# Patient Record
Sex: Male | Born: 1950 | Race: White | Hispanic: No | Marital: Married | Smoking: Former smoker
Health system: Southern US, Community
[De-identification: ages and names within clinical notes are randomized; demographics above are authoritative.]

## PROBLEM LIST (undated history)

## (undated) DIAGNOSIS — E0590 Thyrotoxicosis, unspecified without thyrotoxic crisis or storm: Secondary | ICD-10-CM

## (undated) DIAGNOSIS — I10 Essential (primary) hypertension: Secondary | ICD-10-CM

## (undated) DIAGNOSIS — E785 Hyperlipidemia, unspecified: Secondary | ICD-10-CM

## (undated) DIAGNOSIS — K219 Gastro-esophageal reflux disease without esophagitis: Secondary | ICD-10-CM

## (undated) HISTORY — DX: Essential (primary) hypertension: I10

## (undated) HISTORY — DX: Hyperlipidemia, unspecified: E78.5

## (undated) HISTORY — DX: Gastro-esophageal reflux disease without esophagitis: K21.9

## (undated) HISTORY — DX: Thyrotoxicosis, unspecified without thyrotoxic crisis or storm: E05.90

## (undated) HISTORY — PX: NO PAST SURGERIES: SHX2092

---

## 2013-10-29 DIAGNOSIS — K219 Gastro-esophageal reflux disease without esophagitis: Secondary | ICD-10-CM

## 2013-10-29 DIAGNOSIS — I10 Essential (primary) hypertension: Secondary | ICD-10-CM | POA: Insufficient documentation

## 2013-10-29 HISTORY — DX: Gastro-esophageal reflux disease without esophagitis: K21.9

## 2013-10-29 HISTORY — DX: Essential (primary) hypertension: I10

## 2013-12-24 DIAGNOSIS — E785 Hyperlipidemia, unspecified: Secondary | ICD-10-CM

## 2013-12-24 HISTORY — DX: Hyperlipidemia, unspecified: E78.5

## 2016-06-17 DIAGNOSIS — E0590 Thyrotoxicosis, unspecified without thyrotoxic crisis or storm: Secondary | ICD-10-CM

## 2016-06-17 HISTORY — DX: Thyrotoxicosis, unspecified without thyrotoxic crisis or storm: E05.90

## 2016-10-17 DIAGNOSIS — F418 Other specified anxiety disorders: Secondary | ICD-10-CM

## 2016-10-17 HISTORY — DX: Other specified anxiety disorders: F41.8

## 2018-05-03 ENCOUNTER — Ambulatory Visit (INDEPENDENT_AMBULATORY_CARE_PROVIDER_SITE_OTHER): Payer: 59 | Admitting: Cardiology

## 2018-05-03 ENCOUNTER — Encounter: Payer: Self-pay | Admitting: Cardiology

## 2018-05-03 VITALS — BP 130/74 | HR 73 | Ht 72.0 in | Wt 208.1 lb

## 2018-05-03 DIAGNOSIS — E782 Mixed hyperlipidemia: Secondary | ICD-10-CM

## 2018-05-03 DIAGNOSIS — I10 Essential (primary) hypertension: Secondary | ICD-10-CM

## 2018-05-03 DIAGNOSIS — E0590 Thyrotoxicosis, unspecified without thyrotoxic crisis or storm: Secondary | ICD-10-CM | POA: Diagnosis not present

## 2018-05-03 DIAGNOSIS — R002 Palpitations: Secondary | ICD-10-CM

## 2018-05-03 HISTORY — DX: Palpitations: R00.2

## 2018-05-03 NOTE — Progress Notes (Signed)
Cardiology Consultation:    Date:  05/03/2018   ID:  Nicolas Stevens, DOB 29-Dec-1950, MRN [id number]  PCP:  Estell HarpinGarner Brown, Rutha BouchardKathy Leigh, NP  Cardiologist:  Gypsy Balsamobert Eimy Plaza, MD   Referring MD: Estell HarpinGarner Brown, Nigel MormonKathy Lei*   Chief Complaint  Patient presents with  . Palpitations  . Chest Pain    thought to be from doing work around the house, no longer drinking soft drinks  Mutations  History of Present Illness:    Nicolas Stevens is a 67 y.o. male who is being seen today for the evaluation of dictations at the request of Estell HarpinGarner Brown, Nigel MormonKathy Lei*.  For last few months he is being experiencing palpitations he will feel his heart skipping.  There is no sustained arrhythmias.  Usually does some momentary sensation there is no chest pain no shortness of breath no sweating associated with this sensation.  He is overall nervous person he does have anxiety which obviously complicate this matter somewhat.  Described to have some soreness in the chest after he washed his camper but it is reproducible by pressing chest wall.  He can walk climb stairs with no difficulties.  He retired in January.  Slowly adjusting to his new life.  Has never had any heart trouble.  Past Medical History:  Diagnosis Date  . Esophageal reflux 10/29/2013  . Hyperlipidemia 12/24/2013  . Hypertension, benign 10/29/2013  . Hyperthyroidism 06/17/2016      Current Medications: Current Meds  Medication Sig  . aspirin EC 81 MG tablet Take 1 tablet by mouth daily.  Marland Kitchen. buPROPion (WELLBUTRIN XL) 150 MG 24 hr tablet Take 1 tablet by mouth daily.  . diclofenac (VOLTAREN) 75 MG EC tablet   . lisinopril (PRINIVIL,ZESTRIL) 20 MG tablet Take 1 tablet by mouth daily.  Marland Kitchen. LORazepam (ATIVAN) 0.5 MG tablet Take 1 tablet by mouth 2 (two) times daily.  . methimazole (TAPAZOLE) 5 MG tablet Take 1 tablet by mouth daily.  . metoprolol succinate (TOPROL-XL) 25 MG 24 hr tablet Take 1 tablet by mouth daily.  . mupirocin ointment (BACTROBAN) 2 %  Apply topically 2 (two) times daily.  Marland Kitchen. omeprazole (PRILOSEC) 20 MG capsule Take 1 capsule by mouth daily.     Allergies:   Penicillins and Sulfa antibiotics   Social History   Socioeconomic History  . Marital status: Married    Spouse name: Not on file  . Number of children: Not on file  . Years of education: Not on file  . Highest education level: Not on file  Occupational History  . Not on file  Social Needs  . Financial resource strain: Not on file  . Food insecurity:    Worry: Not on file    Inability: Not on file  . Transportation needs:    Medical: Not on file    Non-medical: Not on file  Tobacco Use  . Smoking status: Former Games developermoker  . Smokeless tobacco: Never Used  Substance and Sexual Activity  . Alcohol use: Never    Frequency: Never  . Drug use: Never  . Sexual activity: Not on file  Lifestyle  . Physical activity:    Days per week: Not on file    Minutes per session: Not on file  . Stress: Not on file  Relationships  . Social connections:    Talks on phone: Not on file    Gets together: Not on file    Attends religious service: Not on file    Active member of  club or organization: Not on file    Attends meetings of clubs or organizations: Not on file    Relationship status: Not on file  Other Topics Concern  . Not on file  Social History Narrative  . Not on file     Family History: The patient's family history includes Cancer in his father. ROS:   Please see the history of present illness.    All 14 point review of systems negative except as described per history of present illness.  EKGs/Labs/Other Studies Reviewed:    The following studies were reviewed today:   EKG:  EKG is  ordered today.  The ekg ordered today demonstrates normal sinus rhythm normal P interval incomplete right bundle branch block nonspecific ST segment changes  Recent Labs: No results found for requested labs within last 8760 hours.  Recent Lipid Panel No results  found for: CHOL, TRIG, HDL, CHOLHDL, VLDL, LDLCALC, LDLDIRECT  Physical Exam:    VS:  BP 130/74   Pulse 73   Ht 6' (1.829 m)   Wt 208 lb 1.9 oz (94.4 kg)   SpO2 98%   BMI 28.23 kg/m     Wt Readings from Last 3 Encounters:  05/03/18 208 lb 1.9 oz (94.4 kg)     GEN:  Well nourished, well developed in no acute distress HEENT: Normal NECK: No JVD; No carotid bruits LYMPHATICS: No lymphadenopathy CARDIAC: RRR, no murmurs, no rubs, no gallops RESPIRATORY:  Clear to auscultation without rales, wheezing or rhonchi  ABDOMEN: Soft, non-tender, non-distended MUSCULOSKELETAL:  No edema; No deformity  SKIN: Warm and dry NEUROLOGIC:  Alert and oriented x 3 PSYCHIATRIC:  Normal affect   ASSESSMENT:    1. Hypertension, benign   2. Palpitations   3. Hyperthyroidism   4. Mixed hyperlipidemia    PLAN:    In order of problems listed above:  1. Palpitations: Most likely extrasystole will ask him to wear long-term Holter.  He tells me that he got palpitations may be once a week.  I will also ask him to have an echocardiogram make sure that structurally his heart is normal. 2. Initial hypertension on appropriate medications appears to be well controlled right now with addition to beta-blocker 3. Hypothyroidism.  Has been controlled with medications he has been followed by internal medicine team. 4. Mixed dyslipidemia not at the point of medications yet however we talked about healthy lifestyle exercises on the regular basis and good diet.  Back in 1 month sooner if he get a problem   Medication Adjustments/Labs and Tests Ordered: Current medicines are reviewed at length with the patient today.  Concerns regarding medicines are outlined above.  No orders of the defined types were placed in this encounter.  No orders of the defined types were placed in this encounter.   Signed, Georgeanna Lea, MD, [facility]. 05/03/2018 11:17 AM    [facility]

## 2018-05-03 NOTE — Patient Instructions (Signed)
Medication Instructions:  Your physician recommends that you continue on your current medications as directed. Please refer to the Current Medication list given to you today.   Labwork: None  Testing/Procedures: You had an EKG today.   Your physician has requested that you have an echocardiogram. Echocardiography is a painless test that uses sound waves to create images of your heart. It provides your doctor with information about the size and shape of your heart and how well your heart's chambers and valves are working. This procedure takes approximately one hour. There are no restrictions for this procedure.  Your physician has recommended that you wear a holter monitor. Holter monitors are medical devices that record the heart's electrical activity. Doctors most often use these monitors to diagnose arrhythmias. Arrhythmias are problems with the speed or rhythm of the heartbeat. The monitor is a small, portable device. You can wear one while you do your normal daily activities. This is usually used to diagnose what is causing palpitations/syncope (passing out). Wear for 7 days.   Follow-Up: Your physician recommends that you schedule a follow-up appointment in: 1 month.  If you need a refill on your cardiac medications before your next appointment, please call your pharmacy.   Thank you for choosing [facility]! [place], RN [phone]    

## 2018-05-25 ENCOUNTER — Ambulatory Visit (HOSPITAL_BASED_OUTPATIENT_CLINIC_OR_DEPARTMENT_OTHER)
Admission: RE | Admit: 2018-05-25 | Discharge: 2018-05-25 | Disposition: A | Discharge status: Home / Self Care | Payer: 59 | Source: Ambulatory Visit | Attending: Nurse Practitioner | Admitting: Nurse Practitioner

## 2018-05-25 DIAGNOSIS — R002 Palpitations: Secondary | ICD-10-CM | POA: Diagnosis not present

## 2018-05-25 DIAGNOSIS — E785 Hyperlipidemia, unspecified: Secondary | ICD-10-CM | POA: Insufficient documentation

## 2018-05-25 DIAGNOSIS — I10 Essential (primary) hypertension: Secondary | ICD-10-CM | POA: Diagnosis not present

## 2018-05-25 NOTE — Progress Notes (Signed)
Echocardiogram 2D Echocardiogram has been performed.  Nicolas Stevens, Nicolas Stevens 05/25/2018, 8:49 AM

## 2018-05-28 ENCOUNTER — Ambulatory Visit: Payer: 59

## 2018-05-28 DIAGNOSIS — R002 Palpitations: Secondary | ICD-10-CM | POA: Diagnosis not present

## 2018-06-01 ENCOUNTER — Telehealth: Payer: Self-pay | Admitting: *Deleted

## 2018-06-01 NOTE — Telephone Encounter (Signed)
Left message to return call in regards to patient's echocardiogram results.

## 2018-06-04 ENCOUNTER — Ambulatory Visit: Payer: 59 | Admitting: Cardiology

## 2018-06-07 ENCOUNTER — Telehealth: Payer: Self-pay | Admitting: *Deleted

## 2018-06-07 NOTE — Telephone Encounter (Signed)
Patient states his blood pressure has been elevated at times since last weekend. Yesterday, he reports his blood pressure being 170/101 and cannot recall what his heart rate was at the time. Patient explained that he took his "blood pressure three times in a row and each time the numbers were higher." Explained to patient that he should only take his blood pressure once in the morning and once in the evening, at the same times every day. Patient reports that his blood pressure this morning was 138/82 and his heart rate was 65. He denies any shortness of breath or chest pain at this time, but does state he felt some palpitations over the weekend. Patient states he has had some chest soreness the past few days that only happens when he is doing heavy lifting. Patient reports,  "I think this is muscle pain."  Patient's wife, Zella BallRobin, called back to inform me that the patient took an extra dose of lisinopril 20 mg and metoprolol succinate 12.5 mg (0.5 tablet) yesterday when his blood pressure was elevated.   Advised both of them to keep patient's follow up appointment tomorrow, 06/08/18 at 10:00 with Dr. Bing MatterKrasowski for further recommendations as patient is stable at this time. Both verbalized understanding. No further questions.

## 2018-06-08 ENCOUNTER — Encounter (INDEPENDENT_AMBULATORY_CARE_PROVIDER_SITE_OTHER): Payer: Self-pay

## 2018-06-08 ENCOUNTER — Ambulatory Visit: Payer: 59 | Admitting: Cardiology

## 2018-06-08 ENCOUNTER — Encounter: Payer: Self-pay | Admitting: Cardiology

## 2018-06-08 VITALS — BP 124/76 | HR 65 | Ht 72.0 in | Wt 204.0 lb

## 2018-06-08 DIAGNOSIS — I10 Essential (primary) hypertension: Secondary | ICD-10-CM

## 2018-06-08 DIAGNOSIS — I712 Thoracic aortic aneurysm, without rupture: Secondary | ICD-10-CM

## 2018-06-08 DIAGNOSIS — E782 Mixed hyperlipidemia: Secondary | ICD-10-CM

## 2018-06-08 DIAGNOSIS — R002 Palpitations: Secondary | ICD-10-CM

## 2018-06-08 DIAGNOSIS — I7121 Aneurysm of the ascending aorta, without rupture: Secondary | ICD-10-CM

## 2018-06-08 DIAGNOSIS — Q991 46, XX true hermaphrodite: Secondary | ICD-10-CM

## 2018-06-08 HISTORY — DX: Thoracic aortic aneurysm, without rupture: I71.2

## 2018-06-08 HISTORY — DX: Aneurysm of the ascending aorta, without rupture: I71.21

## 2018-06-08 NOTE — Patient Instructions (Addendum)
Medication Instructions:  Your physician recommends that you continue on your current medications as directed. Please refer to the Current Medication list given to you today.  Labwork: Your physician recommends that you have the following labs drawn: BMP today  Testing/Procedures: Non-Cardiac CT scanning, (CAT scanning), is a noninvasive, special x-ray that produces cross-sectional images of the body using x-rays and a computer. CT scans help physicians diagnose and treat medical conditions. For some CT exams, a contrast material is used to enhance visibility in the area of the body being studied. CT scans provide greater clarity and reveal more details than regular x-ray exams.  You will be called with an appointment date and time for this once insurance has approved.  Follow-Up: Your physician recommends that you schedule a follow-up appointment in: 1 month  Any Other Special Instructions Will Be Listed Below (If Applicable).     If you need a refill on your cardiac medications before your next appointment, please call your pharmacy.   [facility]  Garey HamAshley A, RN, BSN

## 2018-06-08 NOTE — Progress Notes (Signed)
Cardiology Office Note:    Date:  06/08/2018   ID:  Nicolas Stevens, DOB 02-22-1951, MRN [id number]  PCP:  Estell HarpinGarner Brown, Rutha BouchardKathy Leigh, NP  Cardiologist:  Gypsy Balsamobert Krasowski, MD    Referring MD: Estell HarpinGarner Brown, Nigel MormonKathy Lei*   No chief complaint on file. Feeling better  History of Present Illness:    Nicolas Stevens is a 67 y.o. male with palpitations.  Quite extensive evaluation has been done and finding has been surprising echocardiogram showed ascending aortic aneurysm measuring 4.5 cm.  Obviously he was not aware of this purpose of the visit was to talk about it.  In terms of palpitations is a much better he does have some spikes of high blood pressure recheck his blood pressure quite often and sometimes he finds his blood pressure being elevated.  Denies having any chest pain tightness squeezing pressure burning chest.  Past Medical History:  Diagnosis Date  . Esophageal reflux 10/29/2013  . Hyperlipidemia 12/24/2013  . Hypertension, benign 10/29/2013  . Hyperthyroidism 06/17/2016    Past Surgical History:  Procedure Laterality Date  . NO PAST SURGERIES      Current Medications: Current Meds  Medication Sig  . aspirin EC 81 MG tablet Take 1 tablet by mouth daily.  Marland Kitchen. buPROPion (WELLBUTRIN XL) 150 MG 24 hr tablet Take 1 tablet by mouth daily.  . diclofenac (VOLTAREN) 75 MG EC tablet Take 75 mg by mouth as needed.   Marland Kitchen. lisinopril (PRINIVIL,ZESTRIL) 20 MG tablet Take 1 tablet by mouth daily.  Marland Kitchen. LORazepam (ATIVAN) 0.5 MG tablet Take 1 tablet by mouth at bedtime.   . methimazole (TAPAZOLE) 5 MG tablet Take 1 tablet by mouth daily.  . metoprolol succinate (TOPROL-XL) 25 MG 24 hr tablet Take 1 tablet by mouth daily.  Marland Kitchen. omeprazole (PRILOSEC) 20 MG capsule Take 1 capsule by mouth daily.     Allergies:   Penicillins and Sulfa antibiotics   Social History   Socioeconomic History  . Marital status: Married    Spouse name: Not on file  . Number of children: Not on file  . Years of  education: Not on file  . Highest education level: Not on file  Occupational History  . Not on file  Social Needs  . Financial resource strain: Not on file  . Food insecurity:    Worry: Not on file    Inability: Not on file  . Transportation needs:    Medical: Not on file    Non-medical: Not on file  Tobacco Use  . Smoking status: Former Games developermoker  . Smokeless tobacco: Never Used  Substance and Sexual Activity  . Alcohol use: Never    Frequency: Never  . Drug use: Never  . Sexual activity: Not on file  Lifestyle  . Physical activity:    Days per week: Not on file    Minutes per session: Not on file  . Stress: Not on file  Relationships  . Social connections:    Talks on phone: Not on file    Gets together: Not on file    Attends religious service: Not on file    Active member of club or organization: Not on file    Attends meetings of clubs or organizations: Not on file    Relationship status: Not on file  Other Topics Concern  . Not on file  Social History Narrative  . Not on file     Family History: The patient's family history includes Cancer in his father. ROS:  Please see the history of present illness.    All 14 point review of systems negative except as described per history of present illness  EKGs/Labs/Other Studies Reviewed:      Recent Labs: No results found for requested labs within last 8760 hours.  Recent Lipid Panel No results found for: CHOL, TRIG, HDL, CHOLHDL, VLDL, LDLCALC, LDLDIRECT  Physical Exam:    VS:  BP 124/76 (BP Location: Right Arm, Patient Position: Sitting, Cuff Size: Normal)   Pulse 65   Ht 6' (1.829 m)   Wt 204 lb (92.5 kg)   SpO2 98%   BMI 27.67 kg/m     Wt Readings from Last 3 Encounters:  06/08/18 204 lb (92.5 kg)  05/03/18 208 lb 1.9 oz (94.4 kg)     GEN:  Well nourished, well developed in no acute distress HEENT: Normal NECK: No JVD; No carotid bruits LYMPHATICS: No lymphadenopathy CARDIAC: RRR, no murmurs, no  rubs, no gallops RESPIRATORY:  Clear to auscultation without rales, wheezing or rhonchi  ABDOMEN: Soft, non-tender, non-distended MUSCULOSKELETAL:  No edema; No deformity  SKIN: Warm and dry LOWER EXTREMITIES: no swelling NEUROLOGIC:  Alert and oriented x 3 PSYCHIATRIC:  Normal affect   ASSESSMENT:    1. Hypertension, benign   2. Ascending aortic aneurysm (HCC)   3. Mixed hyperlipidemia   4. Palpitations    PLAN:    In order of problems listed above:  1. Hypertension I will continue present management I will wait for results of his monitor before increasing dose of metoprolol.  He said so far has been feeling better. 2. Ascending aortic moderate was measured 4.5 cm I will schedule him to have CT of his chest and abdomen to look on the entire order to make sure there is minimal not missing any additional aneurysm.  Obviously will take care of his blood pressure.  We will take care of his cholesterol. 3. Mixed dyslipidemia we will continue present management. 4. Palpitations: Improved but will wait for results of the monitor and then unanticipated to increase dose of beta-blocker.    Medication Adjustments/Labs and Tests Ordered: Current medicines are reviewed at length with the patient today.  Concerns regarding medicines are outlined above.  Orders Placed This Encounter  Procedures  . CTA CHEST/ABD/PEL DISSECTION W&/OR WO & GATING  . Basic metabolic panel   Medication changes: No orders of the defined types were placed in this encounter.   Signed, Georgeanna Lea, MD, [facility] 06/08/2018 1:06 PM    [facility]

## 2018-06-12 ENCOUNTER — Ambulatory Visit: Payer: 59 | Admitting: Cardiology

## 2018-06-12 ENCOUNTER — Telehealth: Payer: Self-pay | Admitting: Cardiology

## 2018-06-12 LAB — BASIC METABOLIC PANEL
BUN / CREAT RATIO: 10 (ref 10–24)
BUN: 13 mg/dL (ref 8–27)
CHLORIDE: 103 mmol/L (ref 96–106)
CO2: 24 mmol/L (ref 20–29)
Calcium: 9.6 mg/dL (ref 8.6–10.2)
Creatinine, Ser: 1.24 mg/dL (ref 0.76–1.27)
GFR calc Af Amer: 71 mL/min/{1.73_m2} (ref >59)
GFR calc non Af Amer: 61 mL/min/{1.73_m2} (ref >59)
GLUCOSE: 102 mg/dL — AB (ref 65–99)
Potassium: 4.3 mmol/L (ref 3.5–5.2)
Sodium: 142 mmol/L (ref 134–144)

## 2018-06-12 NOTE — Telephone Encounter (Signed)
Patient called to inquire about lab results and find out about CT scheduling.

## 2018-06-13 ENCOUNTER — Encounter: Payer: Self-pay | Admitting: *Deleted

## 2018-06-13 ENCOUNTER — Telehealth: Payer: Self-pay

## 2018-06-13 NOTE — Telephone Encounter (Signed)
Patient informed of lab and long-term monitor results. Advised patient I would check with Morrie SheldonAshley, RN in [place] where he was seen on 06/08/18 for an office visit regarding the status of scheduling the CTA chest. Informed him that someone would call him to schedule this test. Patient verbalized understanding. No further questions.

## 2018-06-13 NOTE — Addendum Note (Signed)
Addended by: Craige CottaANDERSON, ASHLEY S on: 06/13/2018 04:12 PM   Modules accepted: Orders

## 2018-06-13 NOTE — Telephone Encounter (Signed)
Called to verify if patient wishes to have testing done in [place].

## 2018-06-13 NOTE — Addendum Note (Signed)
Addended by: Craige CottaANDERSON, Coleby Yett S on: 06/13/2018 04:42 PM   Modules accepted: Orders

## 2018-06-14 NOTE — Telephone Encounter (Signed)
CTA chest/abdomen/pelvis [facility] be [facility] location. Order has been modified by Morrie SheldonAshley, RN in [place] office. Nicolas JonesCarolyn has called patient and left message to return call to schedule testing.

## 2018-06-20 ENCOUNTER — Encounter (HOSPITAL_BASED_OUTPATIENT_CLINIC_OR_DEPARTMENT_OTHER): Payer: Self-pay

## 2018-06-20 ENCOUNTER — Ambulatory Visit (HOSPITAL_BASED_OUTPATIENT_CLINIC_OR_DEPARTMENT_OTHER)
Admission: RE | Admit: 2018-06-20 | Discharge: 2018-06-20 | Disposition: A | Discharge status: Home / Self Care | Payer: 59 | Source: Ambulatory Visit | Attending: Cardiology | Admitting: Cardiology

## 2018-06-20 ENCOUNTER — Telehealth: Payer: Self-pay | Admitting: Cardiology

## 2018-06-20 DIAGNOSIS — M1288 Other specific arthropathies, not elsewhere classified, other specified site: Secondary | ICD-10-CM | POA: Diagnosis not present

## 2018-06-20 DIAGNOSIS — K5730 Diverticulosis of large intestine without perforation or abscess without bleeding: Secondary | ICD-10-CM | POA: Insufficient documentation

## 2018-06-20 DIAGNOSIS — K449 Diaphragmatic hernia without obstruction or gangrene: Secondary | ICD-10-CM | POA: Insufficient documentation

## 2018-06-20 DIAGNOSIS — I700 Atherosclerosis of aorta: Secondary | ICD-10-CM | POA: Insufficient documentation

## 2018-06-20 DIAGNOSIS — Q991 46, XX true hermaphrodite: Secondary | ICD-10-CM | POA: Diagnosis present

## 2018-06-20 DIAGNOSIS — R932 Abnormal findings on diagnostic imaging of liver and biliary tract: Secondary | ICD-10-CM | POA: Insufficient documentation

## 2018-06-20 DIAGNOSIS — I77810 Thoracic aortic ectasia: Secondary | ICD-10-CM | POA: Diagnosis not present

## 2018-06-20 DIAGNOSIS — R911 Solitary pulmonary nodule: Secondary | ICD-10-CM | POA: Insufficient documentation

## 2018-06-20 MED ORDER — IOPAMIDOL (ISOVUE-370) INJECTION 76%
100.0000 mL | Freq: Once | INTRAVENOUS | Status: AC | PRN
  Administered 2018-06-20: 100 mL via INTRAVENOUS

## 2018-07-10 ENCOUNTER — Ambulatory Visit (INDEPENDENT_AMBULATORY_CARE_PROVIDER_SITE_OTHER): Payer: 59 | Admitting: Cardiology

## 2018-07-10 ENCOUNTER — Encounter: Payer: Self-pay | Admitting: Cardiology

## 2018-07-10 VITALS — BP 122/68 | HR 66 | Ht 72.0 in | Wt 213.0 lb

## 2018-07-10 DIAGNOSIS — E782 Mixed hyperlipidemia: Secondary | ICD-10-CM | POA: Diagnosis not present

## 2018-07-10 DIAGNOSIS — I10 Essential (primary) hypertension: Secondary | ICD-10-CM

## 2018-07-10 DIAGNOSIS — F418 Other specified anxiety disorders: Secondary | ICD-10-CM | POA: Diagnosis not present

## 2018-07-10 DIAGNOSIS — I712 Thoracic aortic aneurysm, without rupture: Secondary | ICD-10-CM

## 2018-07-10 DIAGNOSIS — I7121 Aneurysm of the ascending aorta, without rupture: Secondary | ICD-10-CM

## 2018-07-10 DIAGNOSIS — R002 Palpitations: Secondary | ICD-10-CM

## 2018-07-10 NOTE — Progress Notes (Signed)
Cardiology Office Note:    Date:  07/10/2018   ID:  Nicolas Stevens, DOB [birth year], MRN [id number]  PCP:  Estell HarpinGarner Brown, Rutha BouchardKathy Leigh, NP  Cardiologist:  Gypsy Balsamobert Leni Pankonin, MD    Referring MD: Estell HarpinGarner Brown, Nigel MormonKathy Lei*   No chief complaint on file. Doing well  History of Present Illness:    Nicolas Stevens is a 67 y.o. male with palpitations, aortic aneurysm, hypertension overall feels well described palpitations be much better and does not bother him at all.  Still trying to be active and do things no shortness of breath chest pain tightness squeezing pressure burning chest.  Past Medical History:  Diagnosis Date  . Esophageal reflux 10/29/2013  . Hyperlipidemia 12/24/2013  . Hypertension, benign 10/29/2013  . Hyperthyroidism 06/17/2016    Past Surgical History:  Procedure Laterality Date  . NO PAST SURGERIES      Current Medications: Current Meds  Medication Sig  . aspirin EC 81 MG tablet Take 1 tablet by mouth daily.  . diclofenac (VOLTAREN) 75 MG EC tablet Take 75 mg by mouth as needed.   Marland Kitchen. lisinopril (PRINIVIL,ZESTRIL) 20 MG tablet Take 1 tablet by mouth daily.  Marland Kitchen. LORazepam (ATIVAN) 0.5 MG tablet Take 1 tablet by mouth at bedtime.   . methimazole (TAPAZOLE) 5 MG tablet Take 1 tablet by mouth daily.  . metoprolol succinate (TOPROL-XL) 25 MG 24 hr tablet Take 1 tablet by mouth daily.  Marland Kitchen. omeprazole (PRILOSEC) 20 MG capsule Take 1 capsule by mouth daily.     Allergies:   Penicillins and Sulfa antibiotics   Social History   Socioeconomic History  . Marital status: Married    Spouse name: Not on file  . Number of children: Not on file  . Years of education: Not on file  . Highest education level: Not on file  Occupational History  . Not on file  Social Needs  . Financial resource strain: Not on file  . Food insecurity:    Worry: Not on file    Inability: Not on file  . Transportation needs:    Medical: Not on file    Non-medical: Not on file  Tobacco Use  .  Smoking status: Former Games developermoker  . Smokeless tobacco: Never Used  Substance and Sexual Activity  . Alcohol use: Never    Frequency: Never  . Drug use: Never  . Sexual activity: Not on file  Lifestyle  . Physical activity:    Days per week: Not on file    Minutes per session: Not on file  . Stress: Not on file  Relationships  . Social connections:    Talks on phone: Not on file    Gets together: Not on file    Attends religious service: Not on file    Active member of club or organization: Not on file    Attends meetings of clubs or organizations: Not on file    Relationship status: Not on file  Other Topics Concern  . Not on file  Social History Narrative  . Not on file     Family History: The patient's family history includes Cancer in his father. ROS:   Please see the history of present illness.    All 14 point review of systems negative except as described per history of present illness  EKGs/Labs/Other Studies Reviewed:      Recent Labs: 06/11/2018: BUN 13; Creatinine, Ser 1.24; Potassium 4.3; Sodium 142  Recent Lipid Panel No results found for: CHOL, TRIG, HDL,  CHOLHDL, VLDL, LDLCALC, LDLDIRECT  Physical Exam:    VS:  BP 122/68 (BP Location: Right Arm, Patient Position: Sitting, Cuff Size: Normal)   Pulse 66   Ht 6' (1.829 m)   Wt 213 lb (96.6 kg)   SpO2 98%   BMI 28.89 kg/m     Wt Readings from Last 3 Encounters:  07/10/18 213 lb (96.6 kg)  06/08/18 204 lb (92.5 kg)  05/03/18 208 lb 1.9 oz (94.4 kg)     GEN:  Well nourished, well developed in no acute distress HEENT: Normal NECK: No JVD; No carotid bruits LYMPHATICS: No lymphadenopathy CARDIAC: RRR, no murmurs, no rubs, no gallops RESPIRATORY:  Clear to auscultation without rales, wheezing or rhonchi  ABDOMEN: Soft, non-tender, non-distended MUSCULOSKELETAL:  No edema; No deformity  SKIN: Warm and dry LOWER EXTREMITIES: no swelling NEUROLOGIC:  Alert and oriented x 3 PSYCHIATRIC:  Normal affect     ASSESSMENT:    1. Hypertension, benign   2. Ascending aortic aneurysm (HCC)   3. Depression with anxiety   4. Mixed hyperlipidemia   5. Palpitations    PLAN:    In order of problems listed above:  1. Ascending aortic aneurysm measuring 4.1 by last CT also does have some pulmonary nodule and will require CT every 6 to 12 months to follow on that.  Overall doing well 2. Essential hypertension blood pressure well controlled we will continue present management. 3. Dyslipidemia followed by internal medicine team. 4. Palpitations denies having any. 5. Depression and anxiety some adjustment of his medication has been made.   Medication Adjustments/Labs and Tests Ordered: Current medicines are reviewed at length with the patient today.  Concerns regarding medicines are outlined above.  No orders of the defined types were placed in this encounter.  Medication changes: No orders of the defined types were placed in this encounter.   Signed, Georgeanna Leaobert J. Margel Joens, MD, [facility] 07/10/2018 9:27 AM    [facility]

## 2018-07-10 NOTE — Patient Instructions (Signed)
Medication Instructions:  Your physician recommends that you continue on your current medications as directed. Please refer to the Current Medication list given to you today.  Labwork: None  Testing/Procedures: None  Follow-Up: Your physician recommends that you schedule a follow-up appointment in: 5 months  Any Other Special Instructions Will Be Listed Below (If Applicable).     If you need a refill on your cardiac medications before your next appointment, please call your pharmacy.   [facility]  Ashley A, RN, BSN  

## 2018-07-10 NOTE — Telephone Encounter (Signed)
error 

## 2018-10-17 DIAGNOSIS — F419 Anxiety disorder, unspecified: Secondary | ICD-10-CM

## 2018-10-17 HISTORY — DX: Anxiety disorder, unspecified: F41.9

## 2019-01-24 ENCOUNTER — Encounter: Payer: Self-pay | Admitting: Cardiology

## 2019-01-24 ENCOUNTER — Ambulatory Visit: Payer: Medicare HMO | Admitting: Cardiology

## 2019-01-24 VITALS — BP 124/80 | HR 91 | Ht 72.0 in | Wt 225.0 lb

## 2019-01-24 DIAGNOSIS — I7121 Aneurysm of the ascending aorta, without rupture: Secondary | ICD-10-CM

## 2019-01-24 DIAGNOSIS — I10 Essential (primary) hypertension: Secondary | ICD-10-CM

## 2019-01-24 DIAGNOSIS — I712 Thoracic aortic aneurysm, without rupture: Secondary | ICD-10-CM

## 2019-01-24 DIAGNOSIS — E782 Mixed hyperlipidemia: Secondary | ICD-10-CM

## 2019-01-24 DIAGNOSIS — I779 Disorder of arteries and arterioles, unspecified: Secondary | ICD-10-CM

## 2019-01-24 DIAGNOSIS — R002 Palpitations: Secondary | ICD-10-CM

## 2019-01-24 LAB — BASIC METABOLIC PANEL
BUN/Creatinine Ratio: 10 (ref 10–24)
BUN: 13 mg/dL (ref 8–27)
CO2: 21 mmol/L (ref 20–29)
CREATININE: 1.27 mg/dL (ref 0.76–1.27)
Calcium: 9.5 mg/dL (ref 8.6–10.2)
Chloride: 103 mmol/L (ref 96–106)
GFR calc Af Amer: 68 mL/min/{1.73_m2} (ref >59)
GFR calc non Af Amer: 59 mL/min/{1.73_m2} — ABNORMAL LOW (ref >59)
GLUCOSE: 79 mg/dL (ref 65–99)
Potassium: 4.3 mmol/L (ref 3.5–5.2)
Sodium: 141 mmol/L (ref 134–144)

## 2019-01-24 NOTE — Patient Instructions (Signed)
Medication Instructions:  Your physician recommends that you continue on your current medications as directed. Please refer to the Current Medication list given to you today.  If you need a refill on your cardiac medications before your next appointment, please call your pharmacy.   Lab work: Your physician recommends that you have a BMP done today.  If you have labs (blood work) drawn today and your tests are completely normal, you will receive your results only by: Marland Kitchen MyChart Message (if you have MyChart) OR . A paper copy in the mail If you have any lab test that is abnormal or we need to change your treatment, we will call you to review the results.  Testing/Procedures: You have been referred for a Chest ct with contrast. Non-Cardiac CT Angiography (CTA), is a special type of CT scan that uses a computer to produce multi-dimensional views of major blood vessels throughout the body. In CT angiography, a contrast material is injected through an IV to help visualize the blood vessels  Follow-Up: At [facility], you and your health needs are our priority.  As part of our continuing mission to provide you with exceptional heart care, we have created designated Provider Care Teams.  These Care Teams include your primary Cardiologist (physician) and Advanced Practice Providers (APPs -  Physician Assistants and Nurse Practitioners) who all work together to provide you with the care you need, when you need it. You will need a follow up appointment in 6 months.     Any Other Special Instructions Will Be Listed Below   Angiogram  An angiogram is a procedure used to examine the blood vessels. In this procedure, contrast dye is injected through a long, thin tube (catheter) into an artery. X-rays are then taken, which show if there is a blockage or problem in a blood vessel. The catheter may be inserted in:  Your groin area. This is the most common.  The fold of your arm, near your  elbow.  Your wrist. Tell a health care provider about:  Any allergies you have, including allergies to shellfish or contrast dye.  All medicines you are taking, including vitamins, herbs, eye drops, creams, and over-the-counter medicines.  Any problems you or family members have had with anesthetic medicines.  Any blood disorders you have.  Any surgeries you have had.  Any previous kidney problems or failure you have had.  Any medical conditions you have.  Whether you are pregnant or may be pregnant.  Whether you are breastfeeding. What are the risks? Generally, this is a safe procedure. However, problems may occur, including:  Infection or bruising at the catheter area.  Damage to other structures or organs, including rupture of blood vessels or damage to arteries.  Allergic reaction to the contrast dye used.  Kidney damage from the contrast dye used.  Blood clots that can lead to a stroke or heart attack. What happens before the procedure? Staying hydrated Follow instructions from your health care provider about hydration, which may include:  Up to 2 hours before the procedure - you may continue to drink clear liquids, such as water, clear fruit juice, black coffee, and plain tea. Eating and drinking restrictions Follow instructions from your health care provider about eating and drinking, which may include:  8 hours before the procedure - stop eating heavy meals or foods such as meat, fried foods, or fatty foods.  6 hours before the procedure - stop eating light meals or foods, such as toast or cereal.  6 hours before the procedure - stop drinking milk or drinks that contain milk.  2 hours before the procedure - stop drinking clear liquids. General instructions  Ask your health care provider about: ? Changing or stopping your normal medicines. This is important if you take diabetes medicines or blood thinners. ? Taking medicines such as aspirin and ibuprofen.  These medicines can thin your blood. Do not take these medicines before your procedure if your doctor tells you not to.  You may have blood samples taken.  Plan to have someone take you home from the hospital or clinic.  If you will be going home right after the procedure, plan to have someone with you for 24 hours. What happens during the procedure?  To reduce your risk of infection: ? Your health care team will wash or sanitize their hands. ? Your skin will be washed with soap. ? Hair may be removed from the insertion area.  You will lie on your back on an X-ray table. You may be strapped to the table if it is tilted.  An IV tube will be inserted into one of your veins.  Electrodes may be placed on your chest to monitor your heart rate during the procedure.  You will be given one or more of the following: ? A medicine to help you relax (sedative). ? A medicine to numb the area where the catheter will be inserted (local anesthetic).  The catheter will be inserted into an artery using a guide wire. A type of X-ray (fluoroscopy) will be used to help guide the catheter to the blood vessel to be examined.  A contrast dye will then be injected into the catheter, and X-rays will be taken. The contrast will help to show where any narrowing or blockages are located in the blood vessels. You may feel flushed as the contrast dye is injected.  After the X-ray is complete, the catheter will be removed.  A bandage (dressing) will be placed over the site where the catheter was inserted. Pressure will be applied to help stop any bleeding. The procedure may vary among health care providers and hospitals. What happens after the procedure?  Your blood pressure, heart rate, breathing rate, and blood oxygen level will be monitored until the medicines you were given have worn off.  You will be kept in bed lying flat for several hours. If the catheter was inserted through your leg, you will be  instructed not to bend or cross your legs.  The insertion area and the pulse in your feet or wrist will be checked frequently.  You will be instructed to drink plenty of fluids. This will help wash the contrast dye out of your body.  Additional blood tests and X-rays may be done.  Tests to check the electrical activity in your heart (electrocardiogram) may be done.  Do not drive for 24 hours if you received a sedative.  It is up to you to get the results of your procedure. Ask your health care provider, or the department that is doing the procedure, when your results will be ready. Summary  An angiogram is a procedure used to examine the blood vessels.  In this procedure, contrast dye is injected through a long, thin tube (catheter) into an artery. X-rays are then taken.  Before the procedure, follow your health care provider's instructions about eating and drinking restrictions. You may be asked to stop eating and drinking several hours before the procedure.  After the procedure, you  will need to lie flat for several hours and drink plenty of fluids. This information is not intended to replace advice given to you by your health care provider. Make sure you discuss any questions you have with your health care provider. Document Released: 08/24/2005 Document Revised: 03/21/2017 Document Reviewed: 12/21/2016 Elsevier Interactive Patient Education  2019 [employer].

## 2019-01-24 NOTE — Progress Notes (Signed)
Cardiology Office Note:    Date:  01/24/2019   ID:  Nicolas Stevens, DOB 29-Sep-1951, MRN [id number]  PCP:  Nicolas Stevens, Nicolas Bouchard, NP  Cardiologist:  Nicolas Balsam, MD    Referring MD: Nicolas Stevens, Nicolas Stevens*   Chief Complaint  Patient presents with  . Follow-up  Doing well  History of Present Illness:    Nicolas Stevens is a 68 y.o. male with ascending arctic aneurysm, palpitations, hypertension comes today to my office follow-up doing well he does not like the some of the year he gets depressed he does not have suicidal ideas and he started getting better he likes to work outside finally he started doing it because weather improves.  Otherwise denies having palpitation no chest pain tightness squeezing pressure burning chest.  Past Medical History:  Diagnosis Date  . Esophageal reflux 10/29/2013  . Hyperlipidemia 12/24/2013  . Hypertension, benign 10/29/2013  . Hyperthyroidism 06/17/2016    Past Surgical History:  Procedure Laterality Date  . NO PAST SURGERIES      Current Medications: Current Meds  Medication Sig  . aspirin EC 81 MG tablet Take 1 tablet by mouth daily.  . diclofenac (VOLTAREN) 75 MG EC tablet Take 75 mg by mouth as needed.   Marland Kitchen lisinopril (PRINIVIL,ZESTRIL) 20 MG tablet Take 1 tablet by mouth daily.  . methimazole (TAPAZOLE) 5 MG tablet Take 1 tablet by mouth daily.  . metoprolol succinate (TOPROL-XL) 25 MG 24 hr tablet Take 1 tablet by mouth daily.  Marland Kitchen omeprazole (PRILOSEC) 20 MG capsule Take 1 capsule by mouth daily.  . pravastatin (PRAVACHOL) 40 MG tablet daily.     Allergies:   Penicillins and Sulfa antibiotics   Social History   Socioeconomic History  . Marital status: Married    Spouse name: Not on file  . Number of children: Not on file  . Years of education: Not on file  . Highest education level: Not on file  Occupational History  . Not on file  Social Needs  . Financial resource strain: Not on file  . Food insecurity:   Worry: Not on file    Inability: Not on file  . Transportation needs:    Medical: Not on file    Non-medical: Not on file  Tobacco Use  . Smoking status: Former Games developer  . Smokeless tobacco: Never Used  Substance and Sexual Activity  . Alcohol use: Never    Frequency: Never  . Drug use: Never  . Sexual activity: Not on file  Lifestyle  . Physical activity:    Days per week: Not on file    Minutes per session: Not on file  . Stress: Not on file  Relationships  . Social connections:    Talks on phone: Not on file    Gets together: Not on file    Attends religious service: Not on file    Active member of club or organization: Not on file    Attends meetings of clubs or organizations: Not on file    Relationship status: Not on file  Other Topics Concern  . Not on file  Social History Narrative  . Not on file     Family History: The patient's family history includes Cancer in his father. ROS:   Please see the history of present illness.    All 14 point review of systems negative except as described per history of present illness  EKGs/Labs/Other Studies Reviewed:      Recent Labs: 06/11/2018: BUN  13; Creatinine, Ser 1.24; Potassium 4.3; Sodium 142  Recent Lipid Panel No results found for: CHOL, TRIG, HDL, CHOLHDL, VLDL, LDLCALC, LDLDIRECT  Physical Exam:    VS:  BP 124/80 (BP Location: Right Arm, Patient Position: Sitting, Cuff Size: Normal)   Pulse 91   Ht 6' (1.829 m)   Wt 225 lb (102.1 kg)   SpO2 97%   BMI 30.52 kg/m     Wt Readings from Last 3 Encounters:  01/24/19 225 lb (102.1 kg)  07/10/18 213 lb (96.6 kg)  06/08/18 204 lb (92.5 kg)     GEN:  Well nourished, well developed in no acute distress HEENT: Normal NECK: No JVD; No carotid bruits LYMPHATICS: No lymphadenopathy CARDIAC: RRR, no murmurs, no rubs, no gallops RESPIRATORY:  Clear to auscultation without rales, wheezing or rhonchi  ABDOMEN: Soft, non-tender, non-distended MUSCULOSKELETAL:  No  edema; No deformity  SKIN: Warm and dry LOWER EXTREMITIES: no swelling NEUROLOGIC:  Alert and oriented x 3 PSYCHIATRIC:  Normal affect   ASSESSMENT:    1. Ascending aortic aneurysm (HCC)   2. Hypertension, benign   3. Palpitations   4. Mixed hyperlipidemia    PLAN:    In order of problems listed above:  1. Ascending octagon was we will schedule him to have CT of his chest especially in view of the fact that he also does some pulmonary nodules that need to be followed. 2. Essential hypertension blood pressure well controlled continue present medications. Palpitations: Denies having any. Mixed dyslipidemia will call primary care physician fasting lipid profile he tells me his last cholesterol was 144.   Medication Adjustments/Labs and Tests Ordered: Current medicines are reviewed at length with the patient today.  Concerns regarding medicines are outlined above.  No orders of the defined types were placed in this encounter.  Medication changes: No orders of the defined types were placed in this encounter.   Signed, Georgeanna Lea, MD, [facility] 01/24/2019 10:04 AM    [facility]

## 2019-05-09 ENCOUNTER — Telehealth: Payer: Self-pay | Admitting: *Deleted

## 2019-05-09 DIAGNOSIS — R0789 Other chest pain: Secondary | ICD-10-CM

## 2019-05-09 NOTE — Telephone Encounter (Signed)
Pt called to inquire about the CT ordered back in Feb. Is this still something that he needs to get done and if so when? Please advise.

## 2019-05-09 NOTE — Addendum Note (Signed)
Addended by: Ashok Norris on: 05/09/2019 04:36 PM   Modules accepted: Orders

## 2019-05-09 NOTE — Telephone Encounter (Signed)
Non contrast CT of the chet for pulmonary nodule needs to be schedule

## 2019-05-09 NOTE — Telephone Encounter (Signed)
Order placed and sent to precert. Will notify patient when we have a appointment date.

## 2019-05-10 NOTE — Telephone Encounter (Signed)
Patient has been scheduled for ct.

## 2019-05-21 ENCOUNTER — Telehealth: Payer: Self-pay | Admitting: Cardiology

## 2019-05-21 NOTE — Telephone Encounter (Signed)
Patients CT was sent for wrong code, this is w/o Contrast and will need Precert for correct test.

## 2019-05-22 ENCOUNTER — Ambulatory Visit (HOSPITAL_BASED_OUTPATIENT_CLINIC_OR_DEPARTMENT_OTHER): Payer: Medicare HMO

## 2019-05-22 NOTE — Telephone Encounter (Signed)
Left message for Gavin Potters to return call.

## 2019-05-24 NOTE — Telephone Encounter (Signed)
Left another message for Nicolas Stevens to return call.

## 2019-05-27 NOTE — Telephone Encounter (Signed)
I have resent for precert. Will schedule when approved.

## 2019-05-28 ENCOUNTER — Ambulatory Visit (HOSPITAL_BASED_OUTPATIENT_CLINIC_OR_DEPARTMENT_OTHER)
Admission: RE | Admit: 2019-05-28 | Discharge: 2019-05-28 | Disposition: A | Discharge status: Home / Self Care | Payer: Medicare HMO | Source: Ambulatory Visit | Attending: Cardiology | Admitting: Cardiology

## 2019-05-28 ENCOUNTER — Other Ambulatory Visit: Payer: Self-pay

## 2019-05-28 DIAGNOSIS — R0789 Other chest pain: Secondary | ICD-10-CM | POA: Insufficient documentation

## 2019-06-06 ENCOUNTER — Telehealth: Payer: Self-pay | Admitting: Cardiology

## 2019-06-06 NOTE — Telephone Encounter (Signed)
Please call patient with ct results

## 2019-06-11 NOTE — Telephone Encounter (Signed)
Results called to patient . See result notes

## 2019-07-19 ENCOUNTER — Ambulatory Visit: Payer: Medicare HMO | Admitting: Cardiology

## 2019-10-14 ENCOUNTER — Other Ambulatory Visit: Payer: Self-pay

## 2019-10-14 ENCOUNTER — Encounter: Payer: Self-pay | Admitting: Cardiology

## 2019-10-14 ENCOUNTER — Ambulatory Visit: Payer: Medicare HMO | Admitting: Cardiology

## 2019-10-14 VITALS — BP 132/82 | HR 86 | Ht 72.0 in | Wt 226.8 lb

## 2019-10-14 DIAGNOSIS — E782 Mixed hyperlipidemia: Secondary | ICD-10-CM

## 2019-10-14 DIAGNOSIS — I712 Thoracic aortic aneurysm, without rupture: Secondary | ICD-10-CM

## 2019-10-14 DIAGNOSIS — R002 Palpitations: Secondary | ICD-10-CM

## 2019-10-14 DIAGNOSIS — I7121 Aneurysm of the ascending aorta, without rupture: Secondary | ICD-10-CM

## 2019-10-14 DIAGNOSIS — I10 Essential (primary) hypertension: Secondary | ICD-10-CM

## 2019-10-14 NOTE — Progress Notes (Signed)
Cardiology Office Note:    Date:  10/14/2019   ID:  Nicolas Stevens, DOB 04-30-1951, MRN [id number]  PCP:  Nicolas Stevens, Nicolas Bouchard, NP  Cardiologist:  Nicolas Balsam, MD    Referring MD: Nicolas Stevens, Nicolas Stevens*   Chief Complaint  Patient presents with  . Follow-up  Doing well  History of Present Illness:    Nicolas Stevens is a 68 y.o. male with history of ascending octagon was measuring 4.1 cm by his last CT done in summer of this year.  Also history of hypertension dyslipidemia.  He comes today to my office for follow-up.  Overall doing well denies having any issue.  No chest pain no tightness no squeezing or pressure burning her chest.  Past Medical History:  Diagnosis Date  . Esophageal reflux 10/29/2013  . Hyperlipidemia 12/24/2013  . Hypertension, benign 10/29/2013  . Hyperthyroidism 06/17/2016    Past Surgical History:  Procedure Laterality Date  . NO PAST SURGERIES      Current Medications: Current Meds  Medication Sig  . aspirin EC 81 MG tablet Take 1 tablet by mouth daily.  . diclofenac (VOLTAREN) 75 MG EC tablet Take 75 mg by mouth as needed.   Marland Kitchen lisinopril (PRINIVIL,ZESTRIL) 20 MG tablet Take 1 tablet by mouth daily.  . methimazole (TAPAZOLE) 5 MG tablet Take 1 tablet by mouth daily.  . metoprolol succinate (TOPROL-XL) 25 MG 24 hr tablet Take 1 tablet by mouth daily.  Marland Kitchen omeprazole (PRILOSEC) 20 MG capsule Take 1 capsule by mouth daily.  . pravastatin (PRAVACHOL) 40 MG tablet daily.     Allergies:   Penicillins and Sulfa antibiotics   Social History   Socioeconomic History  . Marital status: Married    Spouse name: Not on file  . Number of children: Not on file  . Years of education: Not on file  . Highest education level: Not on file  Occupational History  . Not on file  Social Needs  . Financial resource strain: Not on file  . Food insecurity    Worry: Not on file    Inability: Not on file  . Transportation needs    Medical: Not on file    Non-medical: Not on file  Tobacco Use  . Smoking status: Former Games developer  . Smokeless tobacco: Never Used  Substance and Sexual Activity  . Alcohol use: Never    Frequency: Never  . Drug use: Never  . Sexual activity: Not on file  Lifestyle  . Physical activity    Days per week: Not on file    Minutes per session: Not on file  . Stress: Not on file  Relationships  . Social Musician on phone: Not on file    Gets together: Not on file    Attends religious service: Not on file    Active member of club or organization: Not on file    Attends meetings of clubs or organizations: Not on file    Relationship status: Not on file  Other Topics Concern  . Not on file  Social History Narrative  . Not on file     Family History: The patient's family history includes Cancer in his father. ROS:   Please see the history of present illness.    All 14 point review of systems negative except as described per history of present illness  EKGs/Labs/Other Studies Reviewed:      Recent Labs: 01/24/2019: BUN 13; Creatinine, Ser 1.27; Potassium 4.3; Sodium  141  Recent Lipid Panel No results found for: CHOL, TRIG, HDL, CHOLHDL, VLDL, LDLCALC, LDLDIRECT  Physical Exam:    VS:  BP 132/82   Pulse 86   Ht 6' (1.829 m)   Wt 226 lb 12.8 oz (102.9 kg)   SpO2 97%   BMI 30.76 kg/m     Wt Readings from Last 3 Encounters:  10/14/19 226 lb 12.8 oz (102.9 kg)  01/24/19 225 lb (102.1 kg)  07/10/18 213 lb (96.6 kg)     GEN:  Well nourished, well developed in no acute distress HEENT: Normal NECK: No JVD; No carotid bruits LYMPHATICS: No lymphadenopathy CARDIAC: RRR, no murmurs, no rubs, no gallops RESPIRATORY:  Clear to auscultation without rales, wheezing or rhonchi  ABDOMEN: Soft, non-tender, non-distended MUSCULOSKELETAL:  No edema; No deformity  SKIN: Warm and dry LOWER EXTREMITIES: no swelling NEUROLOGIC:  Alert and oriented x 3 PSYCHIATRIC:  Normal affect   ASSESSMENT:     1. Ascending aortic aneurysm ([place])   2. Hypertension, benign   3. Palpitations   4. Mixed hyperlipidemia    PLAN:    In order of problems listed above:  1. Ascending aortic aneurysm we will schedule him to have echocardiogram next year in the summer.  It is always proximal portion of the aorta, therefore he can be evaluated by echocardiogram. 2. Essential hypertension well controlled continue present management 3. Palpitations denies having any 4. Dyslipidemia: Will call primary care physician to get fasting lipid profile   Medication Adjustments/Labs and Tests Ordered: Current medicines are reviewed at length with the patient today.  Concerns regarding medicines are outlined above.  No orders of the defined types were placed in this encounter.  Medication changes: No orders of the defined types were placed in this encounter.   Signed, Nicolas Liter, MD, [facility] 10/14/2019 3:57 PM    Nicolas Stevens

## 2019-10-14 NOTE — Patient Instructions (Signed)

## 2020-07-08 ENCOUNTER — Other Ambulatory Visit: Payer: Self-pay

## 2020-07-09 ENCOUNTER — Other Ambulatory Visit: Payer: Self-pay

## 2020-07-09 ENCOUNTER — Ambulatory Visit: Payer: Medicare HMO | Admitting: Cardiology

## 2020-07-09 ENCOUNTER — Encounter: Payer: Self-pay | Admitting: Cardiology

## 2020-07-09 VITALS — BP 130/86 | HR 92 | Ht 72.0 in | Wt 226.0 lb

## 2020-07-09 DIAGNOSIS — K219 Gastro-esophageal reflux disease without esophagitis: Secondary | ICD-10-CM | POA: Diagnosis not present

## 2020-07-09 DIAGNOSIS — E782 Mixed hyperlipidemia: Secondary | ICD-10-CM | POA: Diagnosis not present

## 2020-07-09 DIAGNOSIS — I712 Thoracic aortic aneurysm, without rupture: Secondary | ICD-10-CM

## 2020-07-09 DIAGNOSIS — I10 Essential (primary) hypertension: Secondary | ICD-10-CM

## 2020-07-09 DIAGNOSIS — I7121 Aneurysm of the ascending aorta, without rupture: Secondary | ICD-10-CM

## 2020-07-09 NOTE — Patient Instructions (Signed)
Medication Instructions:  Your physician recommends that you continue on your current medications as directed. Please refer to the Current Medication list given to you today.  *If you need a refill on your cardiac medications before your next appointment, please call your pharmacy*   Lab Work: None If you have labs (blood work) drawn today and your tests are completely normal, you will receive your results only by: MyChart Message (if you have MyChart) OR A paper copy in the mail If you have any lab test that is abnormal or we need to change your treatment, we will call you to review the results.   Testing/Procedures: Your physician has requested that you have an echocardiogram. Echocardiography is a painless test that uses sound waves to create images of your heart. It provides your doctor with information about the size and shape of your heart and how well your heart's chambers and valves are working. This procedure takes approximately one hour. There are no restrictions for this procedure.    Follow-Up: At [facility], you and your health needs are our priority.  As part of our continuing mission to provide you with exceptional heart care, we have created designated Provider Care Teams.  These Care Teams include your primary Cardiologist (physician) and Advanced Practice Providers (APPs -  Physician Assistants and Nurse Practitioners) who all work together to provide you with the care you need, when you need it.  We recommend signing up for the patient portal called "MyChart".  Sign up information is provided on this After Visit Summary.  MyChart is used to connect with patients for Virtual Visits (Telemedicine).  Patients are able to view lab/test results, encounter notes, upcoming appointments, etc.  Non-urgent messages can be sent to your provider as well.   To learn more about what you can do with MyChart, go to [email or web address].    Your next appointment:   6  month(s)  The format for your next appointment:   In Person  Provider:   Robert Krasowski, MD   Other Instructions   

## 2020-07-09 NOTE — Progress Notes (Signed)
Cardiology Office Note:    Date:  07/09/2020   ID:  Nicolas Stevens, DOB Jul 25, 1951, MRN [id number]  PCP:  Estell Harpin, Rutha Bouchard, NP  Cardiologist:  Gypsy Balsam, MD    Referring MD: Estell Harpin, Nigel Mormon*   No chief complaint on file. I, ambulance yesterday  History of Present Illness:    Nicolas Stevens is a 69 y.o. male with past medical history significant for ascending aortic aneurysm measuring 4.5 cm by echocardiogram, anxiety, essential hypertension, dyslipidemia.  He comes today 2 months for follow-up apparently the night before last he woke up in the middle of the night just not feeling well.  There is no specifics he could not describe really what happened he got water enough that he ended up calling ambulance.  I will ask him to check him out blood pressure was good his heart rate was good EKG did not show any acute changes.  He was told everything is fine since that time he was given prescription for Ativan and that seems to be helping a lot.  Otherwise denies have any chest pain tightness squeezing pressure burning chest.  He does not do much.  He said he spent time sitting in the chair a lot.  But still cut grass and take care of household.  Past Medical History:  Diagnosis Date  . Anxiety 10/17/2018  . Ascending aortic aneurysm (HCC) 06/08/2018   Measuring 4.5 cm by echocardiogram  . Esophageal reflux 10/29/2013  . Hyperlipidemia 12/24/2013  . Hypertension, benign 10/29/2013  . Hyperthyroidism 06/17/2016  . Palpitations 05/03/2018    Past Surgical History:  Procedure Laterality Date  . NO PAST SURGERIES      Current Medications: Current Meds  Medication Sig  . aspirin EC 81 MG tablet Take 1 tablet by mouth daily.  . diclofenac (VOLTAREN) 75 MG EC tablet Take 75 mg by mouth as needed.   Marland Kitchen lisinopril (PRINIVIL,ZESTRIL) 20 MG tablet Take 1 tablet by mouth daily.  Marland Kitchen LORazepam (ATIVAN) 0.5 MG tablet Take 1 tablet by mouth daily as needed.  . methimazole  (TAPAZOLE) 5 MG tablet Take 1 tablet by mouth daily.  . metoprolol succinate (TOPROL-XL) 25 MG 24 hr tablet Take 1 tablet by mouth daily.  Marland Kitchen omeprazole (PRILOSEC) 20 MG capsule Take 1 capsule by mouth daily.  . pravastatin (PRAVACHOL) 40 MG tablet daily.     Allergies:   Penicillins and Sulfa antibiotics   Social History   Socioeconomic History  . Marital status: Married    Spouse name: Not on file  . Number of children: Not on file  . Years of education: Not on file  . Highest education level: Not on file  Occupational History  . Not on file  Tobacco Use  . Smoking status: Former Games developer  . Smokeless tobacco: Never Used  Vaping Use  . Vaping Use: Never used  Substance and Sexual Activity  . Alcohol use: Never  . Drug use: Never  . Sexual activity: Not on file  Other Topics Concern  . Not on file  Social History Narrative  . Not on file   Social Determinants of Health   Financial Resource Strain:   . Difficulty of Paying Living Expenses:   Food Insecurity:   . Worried About Programme researcher, broadcasting/film/video in the Last Year:   . Barista in the Last Year:   Transportation Needs:   . Freight forwarder (Medical):   Marland Kitchen Lack of Transportation (Non-Medical):  Physical Activity:   . Days of Exercise per Week:   . Minutes of Exercise per Session:   Stress:   . Feeling of Stress :   Social Connections:   . Frequency of Communication with Friends and Family:   . Frequency of Social Gatherings with Friends and Family:   . Attends Religious Services:   . Active Member of Clubs or Organizations:   . Attends Banker Meetings:   Marland Kitchen Marital Status:      Family History: The patient's family history includes Cancer in his father. ROS:   Please see the history of present illness.    All 14 point review of systems negative except as described per history of present illness  EKGs/Labs/Other Studies Reviewed:      Recent Labs: No results found for requested labs  within last 8760 hours.  Recent Lipid Panel No results found for: CHOL, TRIG, HDL, CHOLHDL, VLDL, LDLCALC, LDLDIRECT  Physical Exam:    VS:  BP 130/86   Pulse 92   Ht 6' (1.829 m)   Wt 226 lb (102.5 kg)   SpO2 99%   BMI 30.65 kg/m     Wt Readings from Last 3 Encounters:  07/09/20 226 lb (102.5 kg)  10/14/19 226 lb 12.8 oz (102.9 kg)  01/24/19 225 lb (102.1 kg)     GEN:  Well nourished, well developed in no acute distress HEENT: Normal NECK: No JVD; No carotid bruits LYMPHATICS: No lymphadenopathy CARDIAC: RRR, no murmurs, no rubs, no gallops RESPIRATORY:  Clear to auscultation without rales, wheezing or rhonchi  ABDOMEN: Soft, non-tender, non-distended MUSCULOSKELETAL:  No edema; No deformity  SKIN: Warm and dry LOWER EXTREMITIES: no swelling NEUROLOGIC:  Alert and oriented x 3 PSYCHIATRIC:  Normal affect   ASSESSMENT:    1. Ascending aortic aneurysm (HCC)   2. Hypertension, benign   3. Gastroesophageal reflux disease without esophagitis   4. Mixed hyperlipidemia    PLAN:    In order of problems listed above:  1. Ascending octagon was done to recheck and we will schedule him to have an echocardiogram. 2. Essential hypertension blood pressure well controlled continue present management. 3. Gastroesophageal reflux disease no recent symptoms. 4. Mixed dyslipidemia he be scheduled to have fasting lipid profile.   Medication Adjustments/Labs and Tests Ordered: Current medicines are reviewed at length with the patient today.  Concerns regarding medicines are outlined above.  No orders of the defined types were placed in this encounter.  Medication changes: No orders of the defined types were placed in this encounter.   Signed, Georgeanna Lea, MD, [facility] 07/09/2020 4:34 PM    [facility]

## 2020-07-14 ENCOUNTER — Telehealth: Payer: Self-pay

## 2020-07-14 ENCOUNTER — Telehealth: Payer: Self-pay | Admitting: Cardiology

## 2020-07-14 DIAGNOSIS — E782 Mixed hyperlipidemia: Secondary | ICD-10-CM

## 2020-07-14 LAB — LIPID PANEL
Chol/HDL Ratio: 4.5 ratio (ref 0.0–5.0)
Cholesterol, Total: 172 mg/dL (ref 100–199)
HDL: 38 mg/dL — ABNORMAL LOW (ref >39)
LDL Chol Calc (NIH): 106 mg/dL — ABNORMAL HIGH (ref 0–99)
Triglycerides: 157 mg/dL — ABNORMAL HIGH (ref 0–149)
VLDL Cholesterol Cal: 28 mg/dL (ref 5–40)

## 2020-07-14 MED ORDER — PRAVASTATIN SODIUM 80 MG PO TABS: 80.0000 mg | ORAL_TABLET | Freq: Every evening | ORAL | 3 refills | Status: DC

## 2020-07-14 NOTE — Telephone Encounter (Signed)
-----   Message from Robert J Krasowski, MD sent at 07/14/2020  9:04 AM EDT ----- Please increase pravastatin to 80 mg if he is willing.  His LDL is still elevated 106 I would like to see it less than 100.  He need to have fasting lipid profile done within the next 6 weeks 

## 2020-07-14 NOTE — Telephone Encounter (Signed)
Left message on patients voicemail to please return our call.   

## 2020-07-14 NOTE — Telephone Encounter (Signed)
Transferred call to Morgan °

## 2020-07-14 NOTE — Telephone Encounter (Signed)
Spoke with patient regarding results and recommendation.  Patient verbalizes understanding and is agreeable to plan of care. Advised patient to call back with any issues or concerns.  

## 2020-07-14 NOTE — Telephone Encounter (Signed)
-----   Message from Georgeanna Lea, MD sent at 07/14/2020  9:04 AM EDT ----- Please increase pravastatin to 80 mg if he is willing.  His LDL is still elevated 106 I would like to see it less than 100.  He need to have fasting lipid profile done within the next 6 weeks

## 2020-07-30 ENCOUNTER — Other Ambulatory Visit: Payer: Medicare HMO

## 2020-08-07 ENCOUNTER — Ambulatory Visit (INDEPENDENT_AMBULATORY_CARE_PROVIDER_SITE_OTHER): Payer: Medicare HMO

## 2020-08-07 ENCOUNTER — Telehealth: Payer: Self-pay | Admitting: Cardiology

## 2020-08-07 ENCOUNTER — Other Ambulatory Visit: Payer: Self-pay

## 2020-08-07 DIAGNOSIS — I712 Thoracic aortic aneurysm, without rupture: Secondary | ICD-10-CM | POA: Diagnosis not present

## 2020-08-07 DIAGNOSIS — I7121 Aneurysm of the ascending aorta, without rupture: Secondary | ICD-10-CM

## 2020-08-07 LAB — ECHOCARDIOGRAM COMPLETE
Area-P 1/2: 2.8 cm2
S' Lateral: 2.2 cm

## 2020-08-07 NOTE — Progress Notes (Signed)
Complete echocardiogram has been performed.  Jimmy Jaskirat Schwieger RDCS, RVT 

## 2020-08-07 NOTE — Telephone Encounter (Signed)
Patient calling for his echo results. He states he cannot get into his mychart to see them.

## 2020-08-07 NOTE — Telephone Encounter (Signed)
During call patient also reports that he couldn't tolerate the pravastatin 80 mg he is back to 40 mg daily.

## 2020-08-07 NOTE — Telephone Encounter (Signed)
Called patient informed him of results.  

## 2020-11-17 IMAGING — CT CT CHEST WITHOUT CONTRAST
2 of 3 series · 15 of 36 positions shown, 18 images · non-contrast
Comparison: Chest CT 06/20/2018

CLINICAL DATA: Followup pulmonary nodule.

EXAM:
CT CHEST WITHOUT CONTRAST
TECHNIQUE: Multidetector CT imaging of the chest was performed following the
standard protocol without IV contrast.

[Series 2: thorax · axial · 0.82mm/px · z∈[-300,-50]mm · 12 of 147 slices shown, 15 images]
[im 11/147  mediastinal]
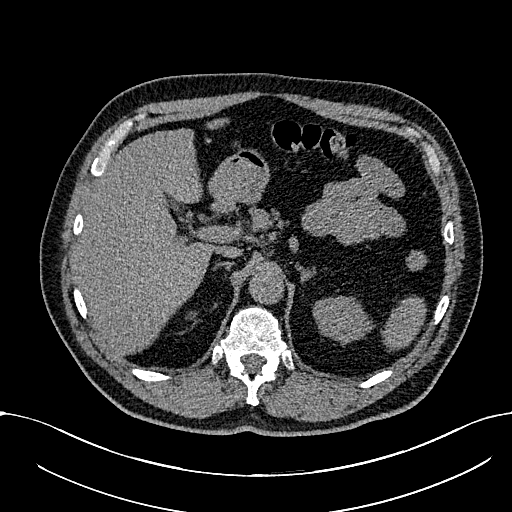
[im 11/147  lung]
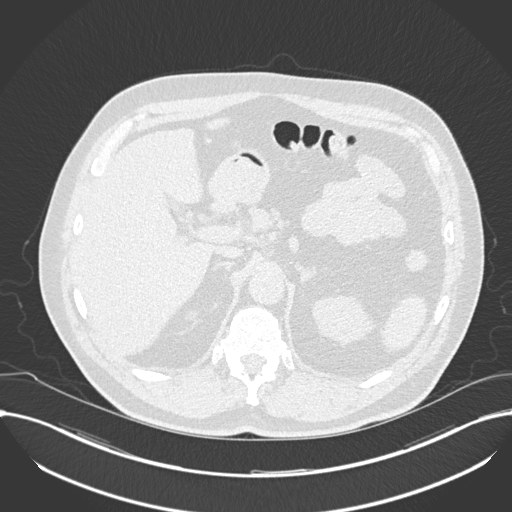
[im 22/147  lung]
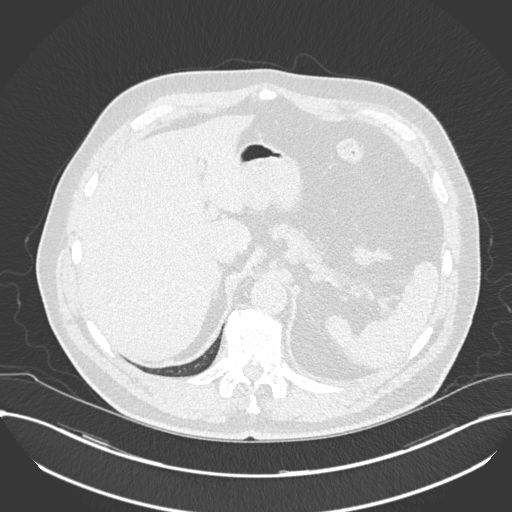
[im 33/147  lung]
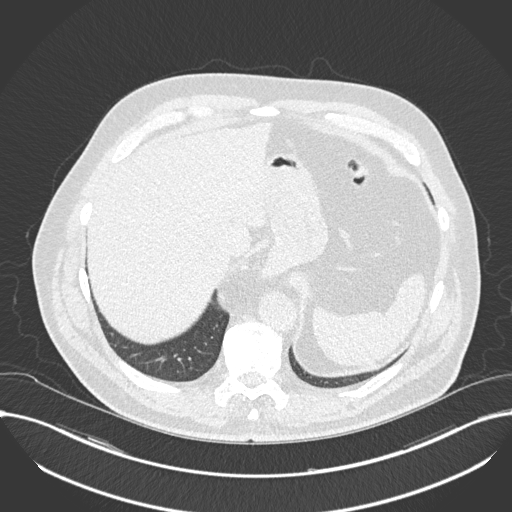
[im 44/147  lung]
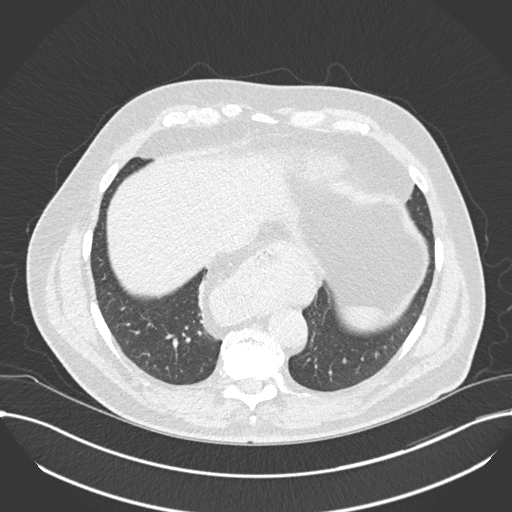
[im 55/147  mediastinal]
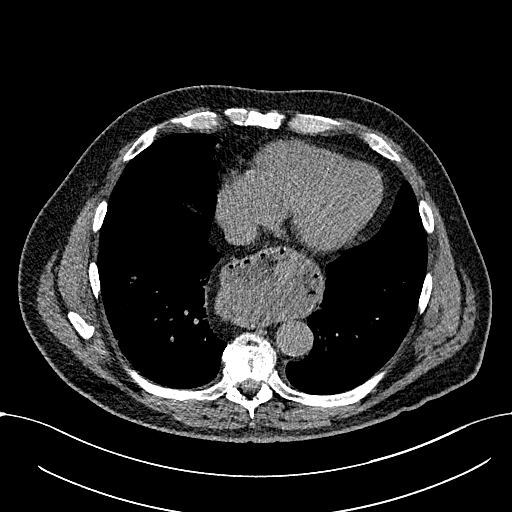
[im 55/147  lung]
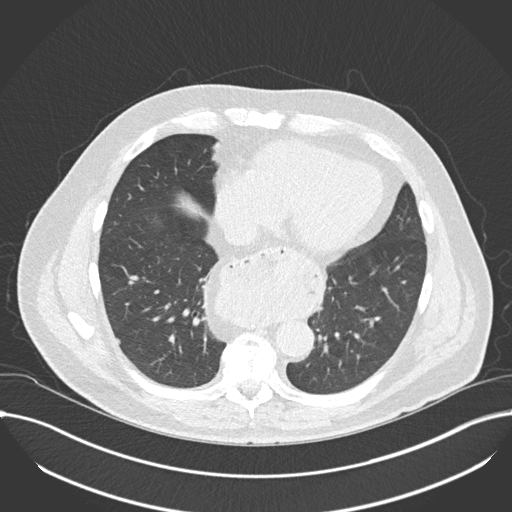
[im 65/147  lung]
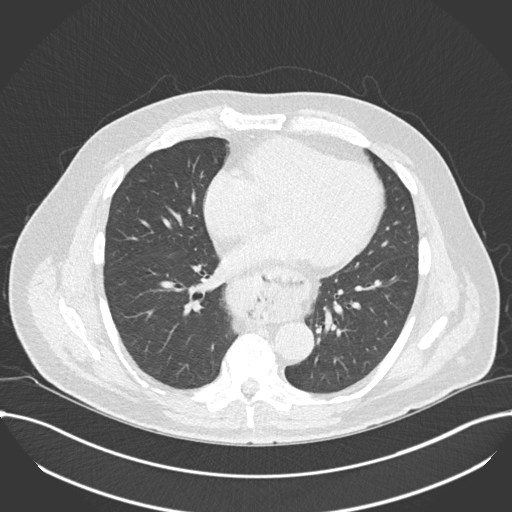
[im 82/147  lung]
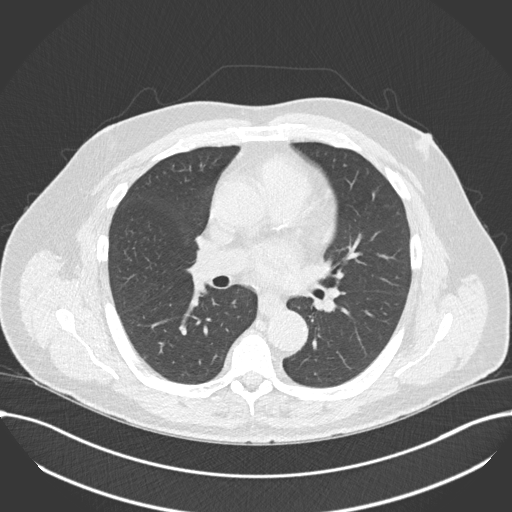
[im 92/147  lung]
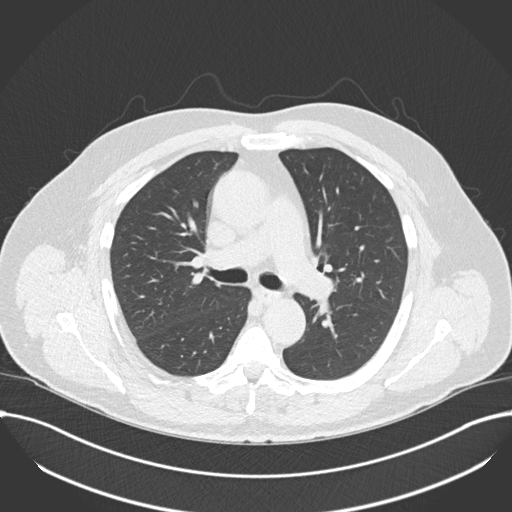
[im 103/147  mediastinal]
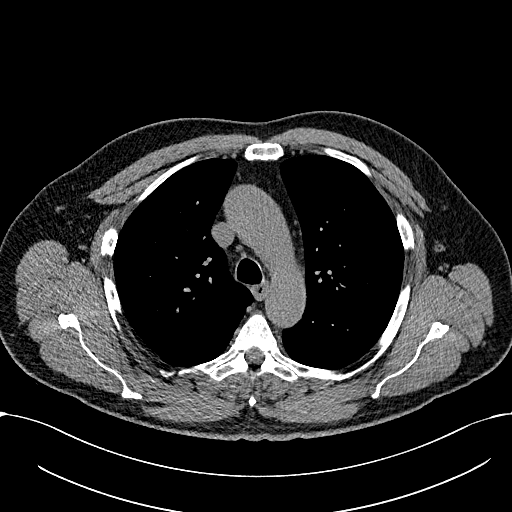
[im 103/147  lung]
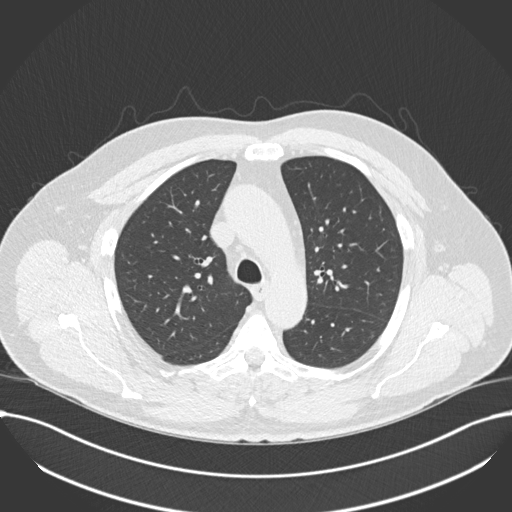
[im 114/147  lung]
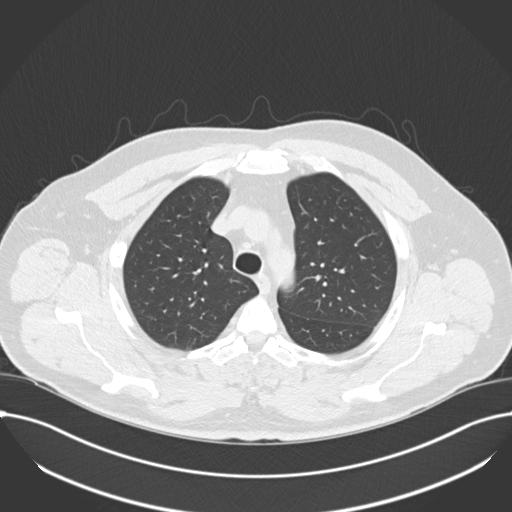
[im 125/147  lung]
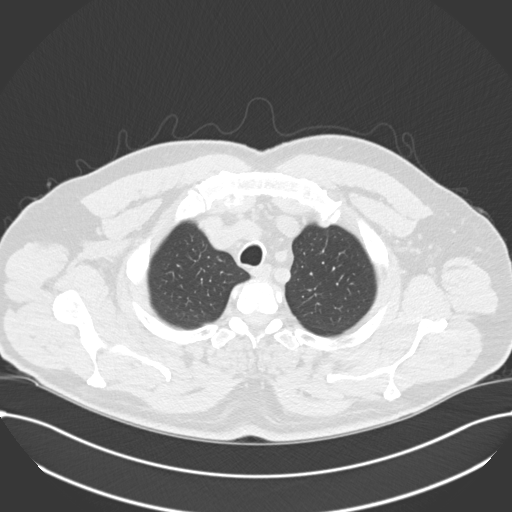
[im 136/147  lung]
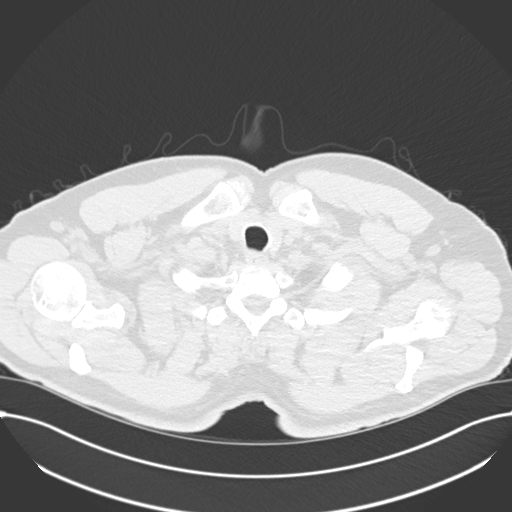

[Series 5: coronal · coronal · 0.61mm/px · 3 of 148 slices shown]
[im 30/148  lung]
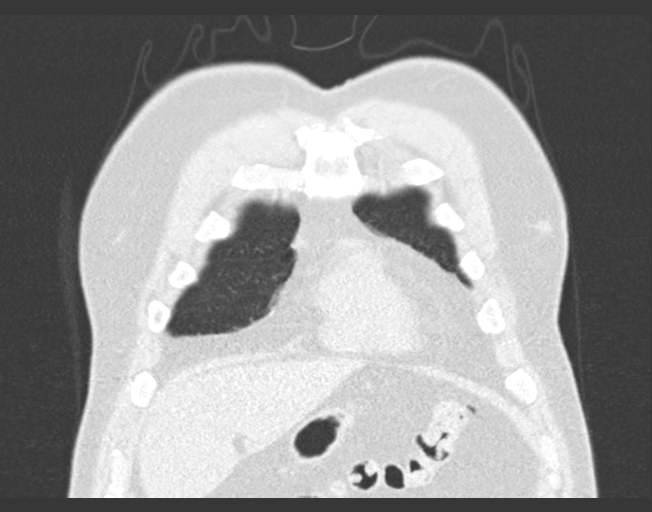
[im 59/148  lung]
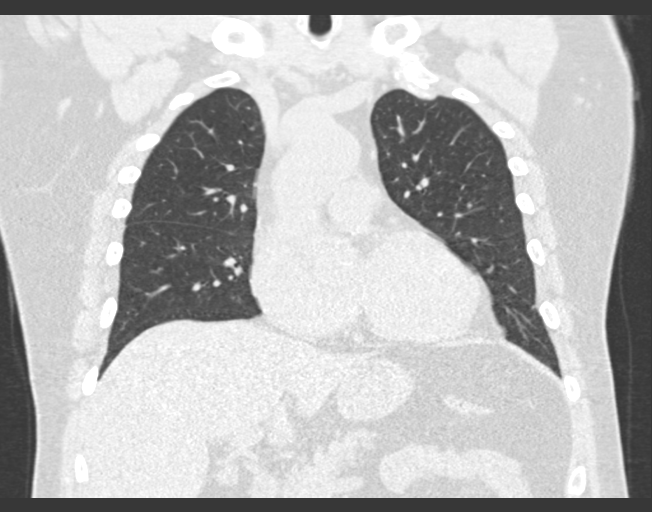
[im 89/148  lung]
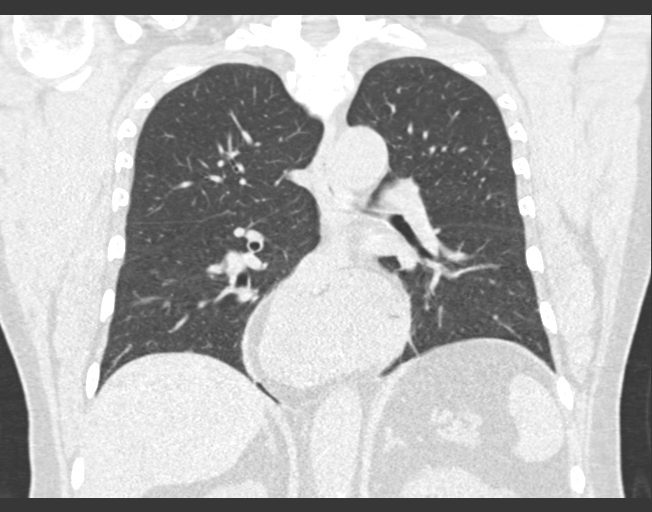

[15 of 36 positions shown; findings below may reference images not displayed]

FINDINGS: Cardiovascular: The heart is normal in size. No pericardial
effusion. There is mild tortuosity, ectasia and calcification of the
thoracic aorta which is stable. Stable coronary artery
calcifications.

Mediastinum/Nodes: No mediastinal or hilar mass or adenopathy. Small
scattered lymph nodes are stable. Stable large hiatal hernia.

Lungs/Pleura: The nodular density in the right lower lobe is stable.
It measures a maximum of 8 mm on image number 86. On the sagittal
images it has a somewhat curvilinear elongated appearance and I
suspect this is most likely some type of vascular anomaly such as a
venous varix or pulmonary AVM.

A few small scattered subpleural pulmonary nodules are stable and
likely lymph nodes.

No worrisome pulmonary lesions or acute overlying pulmonary process.
No interstitial lung disease or bronchiectasis.

Upper Abdomen: No significant upper abdominal findings. No hepatic
or adrenal gland lesions are identified.

Musculoskeletal: Moderate degenerative changes involving the
thoracic spine but no worrisome bone lesions.
IMPRESSION: 1. Stable 8 mm right lower lobe pulmonary lesion. I suspect this is
most likely a vascular anomaly such as a venous varix or pulmonary
AVM. I would recommend 1 more follow-up noncontrast chest CT in 12
months which would be a 2 year follow-up from the initial chest CT.
2. No new pulmonary lesions or acute pulmonary findings.
3. No mediastinal or hilar mass or adenopathy.
4. Stable tortuosity, ectasia and calcification of the thoracic
aorta.

Aortic Atherosclerosis ([id number]).

## 2021-01-06 ENCOUNTER — Other Ambulatory Visit: Payer: Self-pay

## 2021-01-14 ENCOUNTER — Telehealth: Payer: Self-pay | Admitting: Cardiology

## 2021-01-14 NOTE — Telephone Encounter (Signed)
Patient is scheduled for an appointment Monday 2.21.22. He states he will be taking care of his Granddaughter who is 61 years ol that day. He wanted to know if she would be allowed to come with him to his appointment. I was not sure what to tell him in regards to the visitor policy. The additional person is usually a caregviver for the patient. Please let the patient know if hi Granddaughter can attend

## 2021-01-15 NOTE — Telephone Encounter (Signed)
Probably not the best idea.

## 2021-01-18 ENCOUNTER — Ambulatory Visit: Payer: Medicare HMO | Admitting: Cardiology

## 2021-01-18 NOTE — Telephone Encounter (Signed)
Patient called back. He wants to reschedule he also isn't feeling well today so we rescheduled him.

## 2021-01-18 NOTE — Telephone Encounter (Signed)
Left message stating Dr. Bing Matter doesn't feel this is the best idea. Advised him to call with questions.

## 2021-01-27 ENCOUNTER — Other Ambulatory Visit: Payer: Self-pay

## 2021-01-27 ENCOUNTER — Ambulatory Visit: Payer: Medicare HMO | Admitting: Cardiology

## 2021-01-27 ENCOUNTER — Encounter: Payer: Self-pay | Admitting: Cardiology

## 2021-01-27 VITALS — BP 142/80 | HR 93 | Ht 72.0 in | Wt 227.0 lb

## 2021-01-27 DIAGNOSIS — I712 Thoracic aortic aneurysm, without rupture: Secondary | ICD-10-CM

## 2021-01-27 DIAGNOSIS — R002 Palpitations: Secondary | ICD-10-CM

## 2021-01-27 DIAGNOSIS — E782 Mixed hyperlipidemia: Secondary | ICD-10-CM

## 2021-01-27 DIAGNOSIS — F419 Anxiety disorder, unspecified: Secondary | ICD-10-CM

## 2021-01-27 DIAGNOSIS — I7121 Aneurysm of the ascending aorta, without rupture: Secondary | ICD-10-CM

## 2021-01-27 DIAGNOSIS — I10 Essential (primary) hypertension: Secondary | ICD-10-CM | POA: Diagnosis not present

## 2021-01-27 LAB — LIPID PANEL
Chol/HDL Ratio: 4 ratio (ref 0.0–5.0)
Cholesterol, Total: 174 mg/dL (ref 100–199)
HDL: 43 mg/dL (ref >39)
LDL Chol Calc (NIH): 107 mg/dL — ABNORMAL HIGH (ref 0–99)
Triglycerides: 133 mg/dL (ref 0–149)
VLDL Cholesterol Cal: 24 mg/dL (ref 5–40)

## 2021-01-27 MED ORDER — METOPROLOL SUCCINATE ER 50 MG PO TB24: 50.0000 mg | ORAL_TABLET | Freq: Every day | ORAL | 1 refills | Status: DC

## 2021-01-27 NOTE — Patient Instructions (Signed)
Medication Instructions:  Your physician has recommended you make the following change in your medication:   INCREASE: Metoprolol succinate to 50 mg daily  *If you need a refill on your cardiac medications before your next appointment, please call your pharmacy*   Lab Work: Your physician recommends that you return for lab work today: lipid  If you have labs (blood work) drawn today and your tests are completely normal, you will receive your results only by: Marland Kitchen MyChart Message (if you have MyChart) OR . A paper copy in the mail If you have any lab test that is abnormal or we need to change your treatment, we will call you to review the results.   Testing/Procedures: None   Follow-Up: At [facility], you and your health needs are our priority.  As part of our continuing mission to provide you with exceptional heart care, we have created designated Provider Care Teams.  These Care Teams include your primary Cardiologist (physician) and Advanced Practice Providers (APPs -  Physician Assistants and Nurse Practitioners) who all work together to provide you with the care you need, when you need it.  We recommend signing up for the patient portal called "MyChart".  Sign up information is provided on this After Visit Summary.  MyChart is used to connect with patients for Virtual Visits (Telemedicine).  Patients are able to view lab/test results, encounter notes, upcoming appointments, etc.  Non-urgent messages can be sent to your provider as well.   To learn more about what you can do with MyChart, go to [email or web address].    Your next appointment:   5 month(s)  The format for your next appointment:   In Person  Provider:   Gypsy Balsam, MD   Other Instructions

## 2021-01-27 NOTE — Progress Notes (Signed)
Cardiology Office Note:    Date:  01/27/2021   ID:  Nicolas Stevens, DOB Jan 09, 1951, MRN [id number]  PCP:  Estell Harpin, Rutha Bouchard, NP  Cardiologist:  Gypsy Balsam, MD    Referring MD: Estell Harpin, Nigel Mormon*   Chief Complaint  Patient presents with  . Follow-up  I am feeling anxious  History of Present Illness:    Nicolas Stevens is a 70 y.o. male with past medical history significant for ascending aortic aneurysm measuring 45 mm by latest assessment in form of echocardiogram in August 07, 2020, essential hypertension, dyslipidemia, palpitations, anxiety.  He comes today 2 months for follow-up.  He said that he is very anxious about entire situation to work.  He is worried about a lot of things especially water, COVID-19, also he like to be outside in the wintertime he is overall not doing well.  Denies have any chest pain tightness squeezing pressure burning chest.  There is no shortness of breath some fatigue and tiredness is still present.  Past Medical History:  Diagnosis Date  . Anxiety 10/17/2018  . Ascending aortic aneurysm (HCC) 06/08/2018   Measuring 4.5 cm by echocardiogram  . Depression with anxiety 10/17/2016  . Esophageal reflux 10/29/2013  . Hyperlipidemia 12/24/2013  . Hypertension, benign 10/29/2013  . Hyperthyroidism 06/17/2016  . Palpitations 05/03/2018    Past Surgical History:  Procedure Laterality Date  . NO PAST SURGERIES      Current Medications: Current Meds  Medication Sig  . aspirin EC 81 MG tablet Take 1 tablet by mouth daily.  . diclofenac (VOLTAREN) 75 MG EC tablet Take 75 mg by mouth as needed for moderate pain (Arthritis).  Marland Kitchen lisinopril (PRINIVIL,ZESTRIL) 20 MG tablet Take 1 tablet by mouth daily.  Marland Kitchen LORazepam (ATIVAN) 0.5 MG tablet Take 1 tablet by mouth daily as needed for anxiety.  . methimazole (TAPAZOLE) 5 MG tablet Take 1 tablet by mouth daily.  Marland Kitchen omeprazole (PRILOSEC) 20 MG capsule Take 1 capsule by mouth daily.  . pravastatin  (PRAVACHOL) 40 MG tablet Take 1 tablet by mouth at bedtime.  . [DISCONTINUED] metoprolol succinate (TOPROL-XL) 25 MG 24 hr tablet Take 1 tablet by mouth daily.     Allergies:   Penicillins and Sulfa antibiotics   Social History   Socioeconomic History  . Marital status: Married    Spouse name: Not on file  . Number of children: Not on file  . Years of education: Not on file  . Highest education level: Not on file  Occupational History  . Not on file  Tobacco Use  . Smoking status: Former Games developer  . Smokeless tobacco: Never Used  Vaping Use  . Vaping Use: Never used  Substance and Sexual Activity  . Alcohol use: Never  . Drug use: Never  . Sexual activity: Not on file  Other Topics Concern  . Not on file  Social History Narrative  . Not on file   Social Determinants of Health   Financial Resource Strain: Not on file  Food Insecurity: Not on file  Transportation Needs: Not on file  Physical Activity: Not on file  Stress: Not on file  Social Connections: Not on file     Family History: The patient's family history includes Cancer in his father. ROS:   Please see the history of present illness.    All 14 point review of systems negative except as described per history of present illness  EKGs/Labs/Other Studies Reviewed:      Recent  Labs: No results found for requested labs within last 8760 hours.  Recent Lipid Panel    Component Value Date/Time   CHOL 172 07/13/2020 1008   TRIG 157 (H) 07/13/2020 1008   HDL 38 (L) 07/13/2020 1008   CHOLHDL 4.5 07/13/2020 1008   LDLCALC 106 (H) 07/13/2020 1008    Physical Exam:    VS:  BP (!) 142/80 (BP Location: Left Arm, Patient Position: Sitting)   Pulse 93   Ht 6' (1.829 m)   Wt 227 lb (103 kg)   SpO2 96%   BMI 30.79 kg/m     Wt Readings from Last 3 Encounters:  01/27/21 227 lb (103 kg)  07/09/20 226 lb (102.5 kg)  10/14/19 226 lb 12.8 oz (102.9 kg)     GEN:  Well nourished, well developed in no acute  distress HEENT: Normal NECK: No JVD; No carotid bruits LYMPHATICS: No lymphadenopathy CARDIAC: RRR, no murmurs, no rubs, no gallops RESPIRATORY:  Clear to auscultation without rales, wheezing or rhonchi  ABDOMEN: Soft, non-tender, non-distended MUSCULOSKELETAL:  No edema; No deformity  SKIN: Warm and dry LOWER EXTREMITIES: no swelling NEUROLOGIC:  Alert and oriented x 3 PSYCHIATRIC:  Normal affect   ASSESSMENT:    1. Mixed hyperlipidemia   2. Ascending aortic aneurysm (HCC)   3. Hypertension, benign   4. Palpitations   5. Anxiety    PLAN:    In order of problems listed above:  1. Ascending aortic aneurysm.  We will schedule him to have CT of his chest to look at the size of the aneurysm interesting I did review his latest CT from 2020 which only describe ectatic aorta without giving specific number about the size.  Last echocardiogram showed 45 mm size of the aneurysm.  His blood pressure slightly elevated today we will increase dose of Toprol-XL to 50 mg daily.  I also asked him to check blood pressure on the regular basis and avoid isovolumetric exercise. 2. Mixed dyslipidemia he is on pravastatin which I will continue.  We will check his fasting lipid profile today. 3. Essential hypertension still slightly on the higher side.  Will increase dose of beta-blocker which should help. 4. Palpitations denies having any. 5. Anxiety clearly elevated hopefully with better weather and him being able to spend some time outside things will improve.   Medication Adjustments/Labs and Tests Ordered: Current medicines are reviewed at length with the patient today.  Concerns regarding medicines are outlined above.  Orders Placed This Encounter  Procedures  . Lipid panel  . EKG 12-Lead   Medication changes:  Meds ordered this encounter  Medications  . metoprolol succinate (TOPROL-XL) 50 MG 24 hr tablet    Sig: Take 1 tablet (50 mg total) by mouth daily.    Dispense:  90 tablet     Refill:  1    Signed, Georgeanna Lea, MD, [facility] 01/27/2021 10:31 AM    [facility]

## 2021-02-02 ENCOUNTER — Telehealth: Payer: Self-pay

## 2021-02-02 NOTE — Telephone Encounter (Signed)
-----   Message from Georgeanna Lea, MD sent at 02/02/2021  9:36 AM EST ----- Cholesterol still not where it supposed to be.  Please double the dose of pravastatin from 40 to 80 mg daily, Chem-7 need to be done within the next 6 weeks

## 2021-02-02 NOTE — Telephone Encounter (Signed)
Patient notified of results but he stated he once took 80mg  of pravastatin and he couldn't tolerate it(stomach sickness). I sent a message to Dr. for further direction.

## 2021-02-03 NOTE — Progress Notes (Signed)
Message Dr. Bing Matter regarding phone call on 02/02/2021.

## 2021-02-18 ENCOUNTER — Telehealth: Payer: Self-pay

## 2021-02-18 DIAGNOSIS — E782 Mixed hyperlipidemia: Secondary | ICD-10-CM

## 2021-02-18 MED ORDER — EZETIMIBE 10 MG PO TABS: 10.0000 mg | ORAL_TABLET | Freq: Every day | ORAL | 3 refills | Status: DC

## 2021-02-18 NOTE — Telephone Encounter (Signed)
Called patient back. He reports that he cannot afford zetia. He will not be taking. He is going to try to exercise and be better on diet control. Will inform  Dr. Bing Matter.

## 2021-02-18 NOTE — Telephone Encounter (Signed)
Patient calling back.   °

## 2021-02-18 NOTE — Telephone Encounter (Signed)
Patient aware of the following recommendations and agrees with the plan. Patient is aware to come by in 6 weeks for labs fasting.

## 2021-02-18 NOTE — Telephone Encounter (Signed)
-----   Message from Georgeanna Lea, MD sent at 02/18/2021 10:39 AM EDT ----- In this case let us continue the same dose of pravastatin and please to 10 mg of Zetia.  He did have fasting lipid profile done within the 6  weeks ----- Message ----- From: Heywood Bene, CMA Sent: 02/02/2021  10:56 AM EDT To: Georgeanna Lea, MD  Patient notified but he said, he once took 80mg  of pravastatin and he had stomach sickness. Is there anything else he can take? Please advise

## 2021-07-13 ENCOUNTER — Ambulatory Visit: Payer: Medicare HMO | Admitting: Cardiology

## 2021-07-13 ENCOUNTER — Other Ambulatory Visit: Payer: Self-pay

## 2021-07-13 ENCOUNTER — Encounter: Payer: Self-pay | Admitting: Cardiology

## 2021-07-13 VITALS — BP 154/80 | HR 70 | Ht 72.0 in | Wt 227.2 lb

## 2021-07-13 DIAGNOSIS — F419 Anxiety disorder, unspecified: Secondary | ICD-10-CM

## 2021-07-13 DIAGNOSIS — I712 Thoracic aortic aneurysm, without rupture, unspecified: Secondary | ICD-10-CM

## 2021-07-13 DIAGNOSIS — I10 Essential (primary) hypertension: Secondary | ICD-10-CM

## 2021-07-13 DIAGNOSIS — E782 Mixed hyperlipidemia: Secondary | ICD-10-CM | POA: Diagnosis not present

## 2021-07-13 DIAGNOSIS — I7121 Aneurysm of the ascending aorta, without rupture: Secondary | ICD-10-CM

## 2021-07-13 NOTE — Patient Instructions (Addendum)
Medication Instructions:  Your physician recommends that you continue on your current medications as directed. Please refer to the Current Medication list given to you today.  *If you need a refill on your cardiac medications before your next appointment, please call your pharmacy*   Lab Work: None If you have labs (blood work) drawn today and your tests are completely normal, you will receive your results only by: MyChart Message (if you have MyChart) OR A paper copy in the mail If you have any lab test that is abnormal or we need to change your treatment, we will call you to review the results.   Testing/Procedures: We have placed the order for you to have a CT of your chest. They will call you to schedule this once approved by your insurance.    Follow-Up: At [facility], you and your health needs are our priority.  As part of our continuing mission to provide you with exceptional heart care, we have created designated Provider Care Teams.  These Care Teams include your primary Cardiologist (physician) and Advanced Practice Providers (APPs -  Physician Assistants and Nurse Practitioners) who all work together to provide you with the care you need, when you need it.  We recommend signing up for the patient portal called "MyChart".  Sign up information is provided on this After Visit Summary.  MyChart is used to connect with patients for Virtual Visits (Telemedicine).  Patients are able to view lab/test results, encounter notes, upcoming appointments, etc.  Non-urgent messages can be sent to your provider as well.   To learn more about what you can do with MyChart, go to [email or web address].    Your next appointment:   6 month(s)  The format for your next appointment:   In Person  Provider:   Gypsy Balsam, MD   Other Instructions

## 2021-07-13 NOTE — Progress Notes (Signed)
Cardiology Office Note:    Date:  07/13/2021   ID:  Nicolas Stevens, DOB 21-Oct-1951, MRN [id number]  PCP:  Estell Harpin, Rutha Bouchard, NP  Cardiologist:  Gypsy Balsam, MD    Referring MD: Estell Harpin, Nigel Mormon*   No chief complaint on file. Am doing well  History of Present Illness:    Nicolas Stevens is a 70 y.o. male   with past medical history significant for ascending aortic aneurysm measuring 45 mm by latest assessment in form of echocardiogram in August 07, 2020, essential hypertension, dyslipidemia, palpitations, anxiety. He comes today to my office for regular follow-up.  Overall he is doing well.  He denies have any chest pain tightness squeezing pressure burning chest.  He still active have no difficulty doing activities  Past Medical History:  Diagnosis Date   Anxiety 10/17/2018   Ascending aortic aneurysm (HCC) 06/08/2018   Measuring 4.5 cm by echocardiogram   Depression with anxiety 10/17/2016   Esophageal reflux 10/29/2013   Hyperlipidemia 12/24/2013   Hypertension, benign 10/29/2013   Hyperthyroidism 06/17/2016   Palpitations 05/03/2018    Past Surgical History:  Procedure Laterality Date   NO PAST SURGERIES      Current Medications: Current Meds  Medication Sig   aspirin EC 81 MG tablet Take 1 tablet by mouth daily.   diclofenac (VOLTAREN) 75 MG EC tablet Take 75 mg by mouth as needed for moderate pain (Arthritis).   lisinopril (PRINIVIL,ZESTRIL) 20 MG tablet Take 1 tablet by mouth daily.   LORazepam (ATIVAN) 0.5 MG tablet Take 1 tablet by mouth daily as needed for anxiety.   methimazole (TAPAZOLE) 5 MG tablet Take 1 tablet by mouth daily.   metoprolol succinate (TOPROL-XL) 50 MG 24 hr tablet Take 1 tablet (50 mg total) by mouth daily.   omeprazole (PRILOSEC) 20 MG capsule Take 1 capsule by mouth daily.   pravastatin (PRAVACHOL) 40 MG tablet Take 1 tablet by mouth at bedtime.     Allergies:   Penicillins and Sulfa antibiotics   Social History    Socioeconomic History   Marital status: Married    Spouse name: Not on file   Number of children: Not on file   Years of education: Not on file   Highest education level: Not on file  Occupational History   Not on file  Tobacco Use   Smoking status: Former   Smokeless tobacco: Never  Vaping Use   Vaping Use: Never used  Substance and Sexual Activity   Alcohol use: Never   Drug use: Never   Sexual activity: Not on file  Other Topics Concern   Not on file  Social History Narrative   Not on file   Social Determinants of Health   Financial Resource Strain: Not on file  Food Insecurity: Not on file  Transportation Needs: Not on file  Physical Activity: Not on file  Stress: Not on file  Social Connections: Not on file     Family History: The patient's family history includes Cancer in his father. ROS:   Please see the history of present illness.    All 14 point review of systems negative except as described per history of present illness  EKGs/Labs/Other Studies Reviewed:      Recent Labs: No results found for requested labs within last 8760 hours.  Recent Lipid Panel    Component Value Date/Time   CHOL 174 01/27/2021 0000   TRIG 133 01/27/2021 0000   HDL 43 01/27/2021 0000  CHOLHDL 4.0 01/27/2021 0000   LDLCALC 107 (H) 01/27/2021 0000    Physical Exam:    VS:  BP (!) 154/80 (BP Location: Right Arm, Patient Position: Sitting, Cuff Size: Normal)   Pulse 70   Ht 6' (1.829 m)   Wt 227 lb 3.2 oz (103.1 kg)   SpO2 97%   BMI 30.81 kg/m     Wt Readings from Last 3 Encounters:  07/13/21 227 lb 3.2 oz (103.1 kg)  01/27/21 227 lb (103 kg)  07/09/20 226 lb (102.5 kg)     GEN:  Well nourished, well developed in no acute distress HEENT: Normal NECK: No JVD; No carotid bruits LYMPHATICS: No lymphadenopathy CARDIAC: RRR, no murmurs, no rubs, no gallops RESPIRATORY:  Clear to auscultation without rales, wheezing or rhonchi  ABDOMEN: Soft, non-tender,  non-distended MUSCULOSKELETAL:  No edema; No deformity  SKIN: Warm and dry LOWER EXTREMITIES: no swelling NEUROLOGIC:  Alert and oriented x 3 PSYCHIATRIC:  Normal affect   ASSESSMENT:    1. Ascending aortic aneurysm (HCC)   2. Hypertension, benign   3. Mixed hyperlipidemia   4. Anxiety    PLAN:    In order of problems listed above:  Ascending aortic aneurysm.  We will schedule him to have CT angio of the chest to check on the size of the aneurysm. Essential hypertension blood pressure elevated today I asked him to check blood pressure on the regular basis at home. Mixed dyslipidemia I do have his K PN show me his LDL of 107 HDL 43.  He was taking Zetia however could not tolerate this he is taking pravastatin which I will continue we will try to increase dose to 80 mg daily. Anxiety still present but overall seems to be under control.   Medication Adjustments/Labs and Tests Ordered: Current medicines are reviewed at length with the patient today.  Concerns regarding medicines are outlined above.  No orders of the defined types were placed in this encounter.  Medication changes: No orders of the defined types were placed in this encounter.   Signed, Georgeanna Lea, MD, [facility] 07/13/2021 2:11 PM    [facility]

## 2021-07-13 NOTE — Addendum Note (Signed)
Addended by: Delorse Limber I on: 07/13/2021 02:16 PM   Modules accepted: Orders

## 2021-07-16 ENCOUNTER — Ambulatory Visit (HOSPITAL_BASED_OUTPATIENT_CLINIC_OR_DEPARTMENT_OTHER)
Admission: RE | Admit: 2021-07-16 | Discharge: 2021-07-16 | Disposition: A | Discharge status: Home / Self Care | Payer: Medicare HMO | Source: Ambulatory Visit | Attending: Cardiology | Admitting: Cardiology

## 2021-07-16 ENCOUNTER — Other Ambulatory Visit: Payer: Self-pay

## 2021-07-16 DIAGNOSIS — I712 Thoracic aortic aneurysm, without rupture: Secondary | ICD-10-CM | POA: Diagnosis not present

## 2021-07-16 DIAGNOSIS — I7121 Aneurysm of the ascending aorta, without rupture: Secondary | ICD-10-CM

## 2022-01-17 ENCOUNTER — Other Ambulatory Visit: Payer: Self-pay

## 2022-01-17 ENCOUNTER — Encounter: Payer: Self-pay | Admitting: Cardiology

## 2022-01-17 ENCOUNTER — Ambulatory Visit: Payer: Medicare HMO | Admitting: Cardiology

## 2022-01-17 VITALS — BP 126/80 | HR 72 | Ht 72.0 in | Wt 225.0 lb

## 2022-01-17 DIAGNOSIS — F419 Anxiety disorder, unspecified: Secondary | ICD-10-CM

## 2022-01-17 DIAGNOSIS — I10 Essential (primary) hypertension: Secondary | ICD-10-CM | POA: Diagnosis not present

## 2022-01-17 DIAGNOSIS — E782 Mixed hyperlipidemia: Secondary | ICD-10-CM | POA: Diagnosis not present

## 2022-01-17 DIAGNOSIS — I7121 Aneurysm of the ascending aorta, without rupture: Secondary | ICD-10-CM

## 2022-01-17 NOTE — Patient Instructions (Signed)

## 2022-01-17 NOTE — Progress Notes (Signed)
Cardiology Office Note:    Date:  01/17/2022   ID:  Nicolas Stevens, DOB 11-08-1951, MRN [id number]  PCP:  Magdalene Molly, Inda Merlin, NP  Cardiologist:  Jenne Campus, MD    Referring MD: Magdalene Molly, [place]   Chief Complaint  Patient presents with   Follow-up  Doing fine  History of Present Illness:    Nicolas Stevens is a 71 y.o. male  with past medical history significant for ascending aortic aneurysm measuring 45 mm by latest assessment in form of echocardiogram in August 07, 2020, essential hypertension, dyslipidemia, palpitations, anxiety. Comes today to my office for follow-up.  Overall seems to be doing well.  He denies have any chest pain tightness squeezing pressure burning chest no palpitations dizziness swelling of lower extremities.  Complain about cold weather.  He does not like cold weather however looking forward to to will start working in the garden and springtime  Past Medical History:  Diagnosis Date   Anxiety 10/17/2018   Ascending aortic aneurysm 06/08/2018   Measuring 4.5 cm by echocardiogram   Depression with anxiety 10/17/2016   Esophageal reflux 10/29/2013   Hyperlipidemia 12/24/2013   Hypertension, benign 10/29/2013   Hyperthyroidism 06/17/2016   Palpitations 05/03/2018    Past Surgical History:  Procedure Laterality Date   NO PAST SURGERIES      Current Medications: Current Meds  Medication Sig   aspirin EC 81 MG tablet Take 1 tablet by mouth daily.   diclofenac Sodium (VOLTAREN) 1 % GEL Apply 2 g topically as needed (pain).   lisinopril (PRINIVIL,ZESTRIL) 20 MG tablet Take 1 tablet by mouth daily.   LORazepam (ATIVAN) 0.5 MG tablet Take 1 tablet by mouth daily as needed for anxiety.   methimazole (TAPAZOLE) 5 MG tablet Take 1 tablet by mouth daily.   metoprolol succinate (TOPROL-XL) 25 MG 24 hr tablet Take 25 mg by mouth daily.   omeprazole (PRILOSEC) 20 MG capsule Take 1 capsule by mouth daily.   pravastatin (PRAVACHOL) 40 MG tablet  Take 1 tablet by mouth at bedtime.   [DISCONTINUED] metoprolol succinate (TOPROL-XL) 50 MG 24 hr tablet Take 1 tablet (50 mg total) by mouth daily.     Allergies:   Penicillins and Sulfa antibiotics   Social History   Socioeconomic History   Marital status: Married    Spouse name: Not on file   Number of children: Not on file   Years of education: Not on file   Highest education level: Not on file  Occupational History   Not on file  Tobacco Use   Smoking status: Former   Smokeless tobacco: Never  Vaping Use   Vaping Use: Never used  Substance and Sexual Activity   Alcohol use: Never   Drug use: Never   Sexual activity: Not on file  Other Topics Concern   Not on file  Social History Narrative   Not on file   Social Determinants of Health   Financial Resource Strain: Not on file  Food Insecurity: Not on file  Transportation Needs: Not on file  Physical Activity: Not on file  Stress: Not on file  Social Connections: Not on file     Family History: The patient's family history includes Cancer in his father. ROS:   Please see the history of present illness.    All 14 point review of systems negative except as described per history of present illness  EKGs/Labs/Other Studies Reviewed:      Recent Labs: No results found  for requested labs within last 8760 hours.  Recent Lipid Panel    Component Value Date/Time   CHOL 174 01/27/2021 0000   TRIG 133 01/27/2021 0000   HDL 43 01/27/2021 0000   CHOLHDL 4.0 01/27/2021 0000   LDLCALC 107 (H) 01/27/2021 0000    Physical Exam:    VS:  BP 126/80 (BP Location: Left Arm, Patient Position: Sitting, Cuff Size: Normal)    Pulse 72    Ht 6' (1.829 m)    Wt 225 lb (102.1 kg)    SpO2 96%    BMI 30.52 kg/m     Wt Readings from Last 3 Encounters:  01/17/22 225 lb (102.1 kg)  07/13/21 227 lb 3.2 oz (103.1 kg)  01/27/21 227 lb (103 kg)     GEN:  Well nourished, well developed in no acute distress HEENT: Normal NECK: No  JVD; No carotid bruits LYMPHATICS: No lymphadenopathy CARDIAC: RRR, no murmurs, no rubs, no gallops RESPIRATORY:  Clear to auscultation without rales, wheezing or rhonchi  ABDOMEN: Soft, non-tender, non-distended MUSCULOSKELETAL:  No edema; No deformity  SKIN: Warm and dry LOWER EXTREMITIES: no swelling NEUROLOGIC:  Alert and oriented x 3 PSYCHIATRIC:  Normal affect   ASSESSMENT:    1. Aneurysm of ascending aorta without rupture   2. Hypertension, benign   3. Mixed hyperlipidemia   4. Anxiety    PLAN:    In order of problems listed above:  Aneurysm of the ascending aorta last measurement in September 46 mm.  We will repeat ultrasounds of his heart to recheck on the size of the aneurysm.  He does have any pain asymptomatic. Essential hypertension blood pressure seems to be well controlled continue present management. Mixed dyslipidemia I did review his K PN his LDL was 107 HDL 43 this is from March of last year I wanted to do another cholesterol however he tells me that his doctor check cholesterol told him everything is good.  We will get copy of it. Anxiety.  Seems to be under control.   Medication Adjustments/Labs and Tests Ordered: Current medicines are reviewed at length with the patient today.  Concerns regarding medicines are outlined above.  No orders of the defined types were placed in this encounter.  Medication changes: No orders of the defined types were placed in this encounter.   Signed, Park Liter, MD, [facility] 01/17/2022 2:28 PM    [place]

## 2022-01-17 NOTE — Addendum Note (Signed)
Addended by: Roxanne Mins I on: 01/17/2022 02:35 PM   Modules accepted: Orders

## 2022-01-19 ENCOUNTER — Other Ambulatory Visit: Payer: Self-pay

## 2022-01-19 ENCOUNTER — Ambulatory Visit (INDEPENDENT_AMBULATORY_CARE_PROVIDER_SITE_OTHER): Payer: Medicare HMO

## 2022-01-19 DIAGNOSIS — E782 Mixed hyperlipidemia: Secondary | ICD-10-CM | POA: Diagnosis not present

## 2022-01-19 DIAGNOSIS — I7121 Aneurysm of the ascending aorta, without rupture: Secondary | ICD-10-CM | POA: Diagnosis not present

## 2022-01-19 DIAGNOSIS — F419 Anxiety disorder, unspecified: Secondary | ICD-10-CM

## 2022-01-19 DIAGNOSIS — I10 Essential (primary) hypertension: Secondary | ICD-10-CM | POA: Diagnosis not present

## 2022-01-19 LAB — ECHOCARDIOGRAM COMPLETE
Area-P 1/2: 3.31 cm2
S' Lateral: 3.5 cm

## 2022-07-18 ENCOUNTER — Ambulatory Visit: Payer: Medicare HMO | Admitting: Cardiology

## 2022-10-05 ENCOUNTER — Ambulatory Visit: Payer: Medicare HMO | Attending: Cardiology | Admitting: Cardiology

## 2022-10-05 ENCOUNTER — Encounter: Payer: Self-pay | Admitting: Cardiology

## 2022-10-05 VITALS — BP 128/80 | HR 93 | Ht 72.0 in | Wt 217.8 lb

## 2022-10-05 DIAGNOSIS — E782 Mixed hyperlipidemia: Secondary | ICD-10-CM | POA: Diagnosis not present

## 2022-10-05 DIAGNOSIS — R002 Palpitations: Secondary | ICD-10-CM

## 2022-10-05 DIAGNOSIS — I7121 Aneurysm of the ascending aorta, without rupture: Secondary | ICD-10-CM

## 2022-10-05 DIAGNOSIS — I10 Essential (primary) hypertension: Secondary | ICD-10-CM | POA: Diagnosis not present

## 2022-10-05 NOTE — Progress Notes (Signed)
Cardiology Office Note:    Date:  10/05/2022   ID:  Nicolas Stevens, DOB [birth year], MRN [id number]  PCP:  Estell Harpin, Rutha Bouchard, NP  Cardiologist:  Gypsy Balsam, MD    Referring MD: Estell Harpin, Nigel Mormon*   Chief Complaint  Patient presents with   Follow-up    History of Present Illness:    Nicolas Stevens is a 71 y.o. male with past medical history significant for ascending aortic aneurysm measuring 45 mm on latest assessment by echocardiogram from February 2022, essential hypertension, dyslipidemia, palpitation, anxiety.  He is in my office today for follow-up.  Overall he is doing very well.  He denies have any chest pain, tightness, pressure, burning chest.  Past Medical History:  Diagnosis Date   Anxiety 10/17/2018   Ascending aortic aneurysm (HCC) 06/08/2018   Measuring 4.5 cm by echocardiogram   Depression with anxiety 10/17/2016   Esophageal reflux 10/29/2013   Hyperlipidemia 12/24/2013   Hypertension, benign 10/29/2013   Hyperthyroidism 06/17/2016   Palpitations 05/03/2018    Past Surgical History:  Procedure Laterality Date   NO PAST SURGERIES      Current Medications: Current Meds  Medication Sig   aspirin EC 81 MG tablet Take 1 tablet by mouth daily.   diclofenac Sodium (VOLTAREN) 1 % GEL Apply 2 g topically as needed (pain).   lisinopril (PRINIVIL,ZESTRIL) 20 MG tablet Take 1 tablet by mouth daily.   LORazepam (ATIVAN) 0.5 MG tablet Take 1 tablet by mouth daily as needed for anxiety.   methimazole (TAPAZOLE) 5 MG tablet Take 1 tablet by mouth daily.   metoprolol succinate (TOPROL-XL) 25 MG 24 hr tablet Take 25 mg by mouth daily.   omeprazole (PRILOSEC) 20 MG capsule Take 1 capsule by mouth daily.   pravastatin (PRAVACHOL) 40 MG tablet Take 1 tablet by mouth at bedtime.     Allergies:   Penicillins and Sulfa antibiotics   Social History   Socioeconomic History   Marital status: Married    Spouse name: Not on file   Number of children: Not on  file   Years of education: Not on file   Highest education level: Not on file  Occupational History   Not on file  Tobacco Use   Smoking status: Former   Smokeless tobacco: Never  Vaping Use   Vaping Use: Never used  Substance and Sexual Activity   Alcohol use: Never   Drug use: Never   Sexual activity: Not on file  Other Topics Concern   Not on file  Social History Narrative   Not on file   Social Determinants of Health   Financial Resource Strain: Not on file  Food Insecurity: Not on file  Transportation Needs: Not on file  Physical Activity: Not on file  Stress: Not on file  Social Connections: Not on file     Family History: The patient's family history includes Cancer in his father. ROS:   Please see the history of present illness.    All 14 point review of systems negative except as described per history of present illness  EKGs/Labs/Other Studies Reviewed:      Recent Labs: No results found for requested labs within last 365 days.  Recent Lipid Panel    Component Value Date/Time   CHOL 174 01/27/2021 0000   TRIG 133 01/27/2021 0000   HDL 43 01/27/2021 0000   CHOLHDL 4.0 01/27/2021 0000   LDLCALC 107 (H) 01/27/2021 0000    Physical Exam:  VS:  BP 128/80 (BP Location: Left Arm, Patient Position: Sitting)   Pulse 93   Ht 6' (1.829 m)   Wt 217 lb 12.8 oz (98.8 kg)   SpO2 96%   BMI 29.54 kg/m     Wt Readings from Last 3 Encounters:  10/05/22 217 lb 12.8 oz (98.8 kg)  01/17/22 225 lb (102.1 kg)  07/13/21 227 lb 3.2 oz (103.1 kg)     GEN:  Well nourished, well developed in no acute distress HEENT: Normal NECK: No JVD; No carotid bruits LYMPHATICS: No lymphadenopathy CARDIAC: RRR, no murmurs, no rubs, no gallops RESPIRATORY:  Clear to auscultation without rales, wheezing or rhonchi  ABDOMEN: Soft, non-tender, non-distended MUSCULOSKELETAL:  No edema; No deformity  SKIN: Warm and dry LOWER EXTREMITIES: no swelling NEUROLOGIC:  Alert and  oriented x 3 PSYCHIATRIC:  Normal affect   ASSESSMENT:    1. Aneurysm of ascending aorta without rupture (HCC)   2. Palpitations   3. Mixed hyperlipidemia   4. Hypertension, benign    PLAN:    In order of problems listed above:  Ascending aortic aneurysm.  Echocardiogram will be done in February to recheck size of the aneurysm so far has been stable Palpitations: Denies having any. Mixed dyslipidemia I do have data from last year which show me LDL of 107 HDL 43 this is K PN, however he showed me his lab work test done by primary care physician with LDL less than 100.  We will continue present management. Essential hypertension: Well managed   Medication Adjustments/Labs and Tests Ordered: Current medicines are reviewed at length with the patient today.  Concerns regarding medicines are outlined above.  No orders of the defined types were placed in this encounter.  Medication changes: No orders of the defined types were placed in this encounter.   Signed, Georgeanna Lea, MD, [facility] 10/05/2022 12:00 PM    [facility]

## 2022-10-05 NOTE — Patient Instructions (Signed)
Medication Instructions:  Your physician recommends that you continue on your current medications as directed. Please refer to the Current Medication list given to you today.  *If you need a refill on your cardiac medications before your next appointment, please call your pharmacy*   Lab Work: None If you have labs (blood work) drawn today and your tests are completely normal, you will receive your results only by: MyChart Message (if you have MyChart) OR A paper copy in the mail If you have any lab test that is abnormal or we need to change your treatment, we will call you to review the results.   Testing/Procedures: Your physician has requested that you have an echocardiogram in February. Echocardiography is a painless test that uses sound waves to create images of your heart. It provides your doctor with information about the size and shape of your heart and how well your heart's chambers and valves are working. This procedure takes approximately one hour. There are no restrictions for this procedure. Please do NOT wear cologne, perfume, aftershave, or lotions (deodorant is allowed). Please arrive 15 minutes prior to your appointment time.    Follow-Up: At [facility], you and your health needs are our priority.  As part of our continuing mission to provide you with exceptional heart care, we have created designated Provider Care Teams.  These Care Teams include your primary Cardiologist (physician) and Advanced Practice Providers (APPs -  Physician Assistants and Nurse Practitioners) who all work together to provide you with the care you need, when you need it.  We recommend signing up for the patient portal called "MyChart".  Sign up information is provided on this After Visit Summary.  MyChart is used to connect with patients for Virtual Visits (Telemedicine).  Patients are able to view lab/test results, encounter notes, upcoming appointments, etc.  Non-urgent messages can be  sent to your provider as well.   To learn more about what you can do with MyChart, go to [email or web address].    Your next appointment:   1 year(s)  The format for your next appointment:   In Person  Provider:   Gypsy Balsam, MD    Other Instructions   Important Information About Sugar

## 2023-01-06 IMAGING — CT CT CHEST W/O CM
2 of 3 series · 14 of 36 positions shown, 17 images · non-contrast
Comparison: 05/28/2019

CLINICAL DATA: Thoracic aortic aneurysm.

EXAM:
CT CHEST WITHOUT CONTRAST
TECHNIQUE: Multidetector CT imaging of the chest was performed following the
standard protocol without IV contrast.

[Series 2: thorax · axial · 0.79mm/px · z∈[-167,+101]mm · 11 of 158 slices shown, 14 images]
[im 12/158  mediastinal]
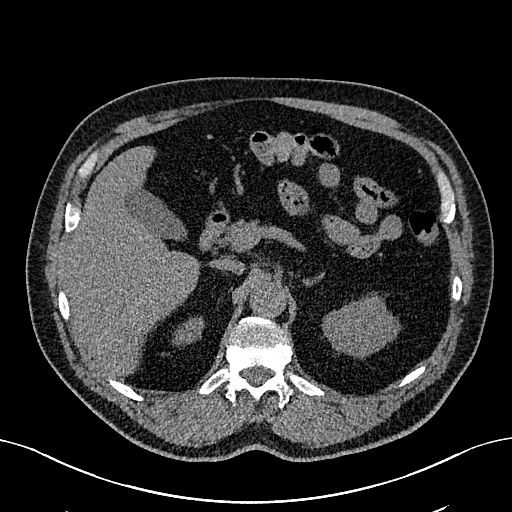
[im 12/158  lung]
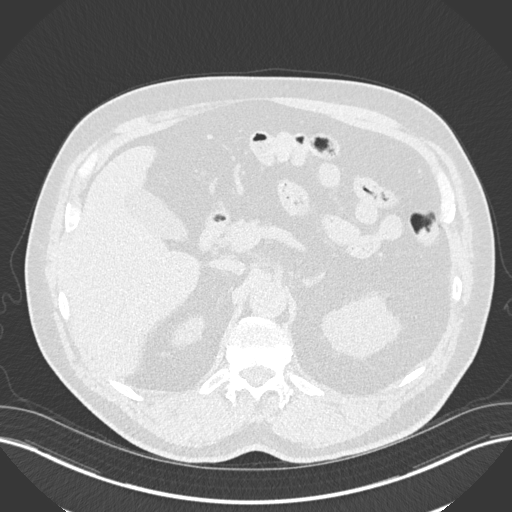
[im 24/158  lung]
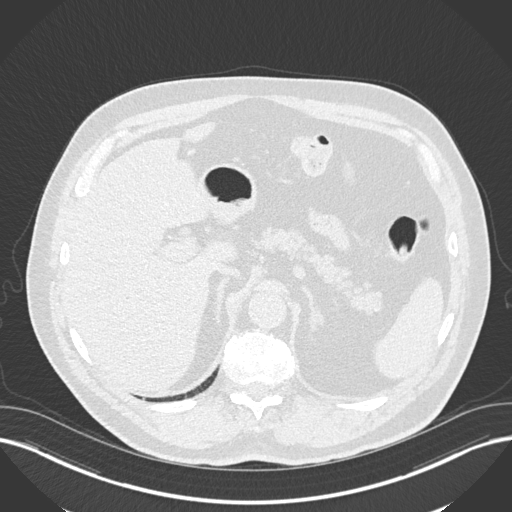
[im 35/158  lung]
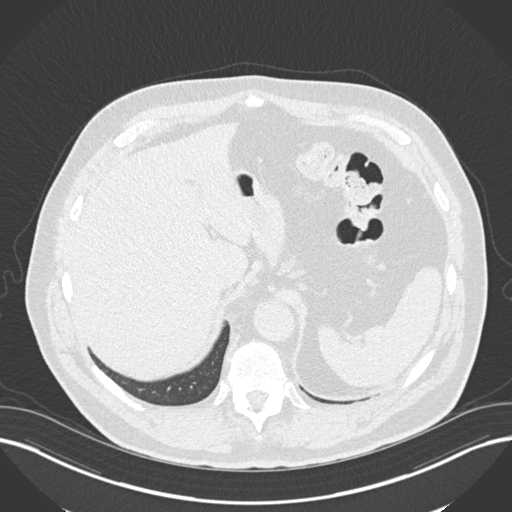
[im 53/158  lung]
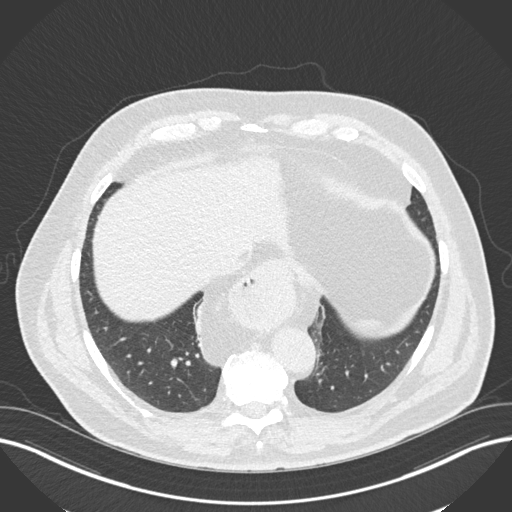
[im 64/158  mediastinal]
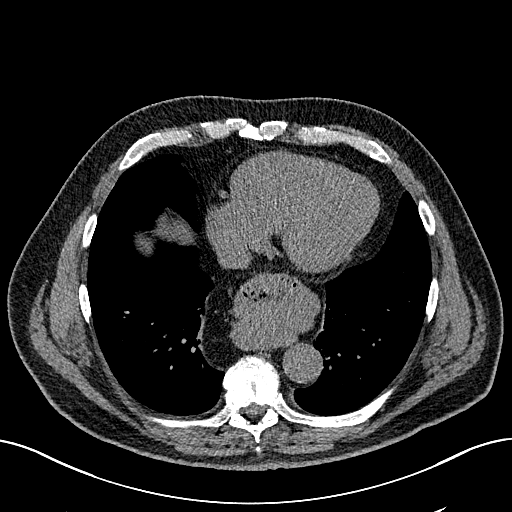
[im 64/158  lung]
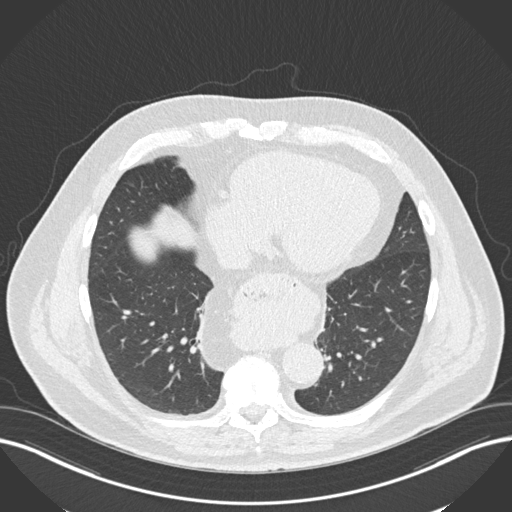
[im 82/158  lung]
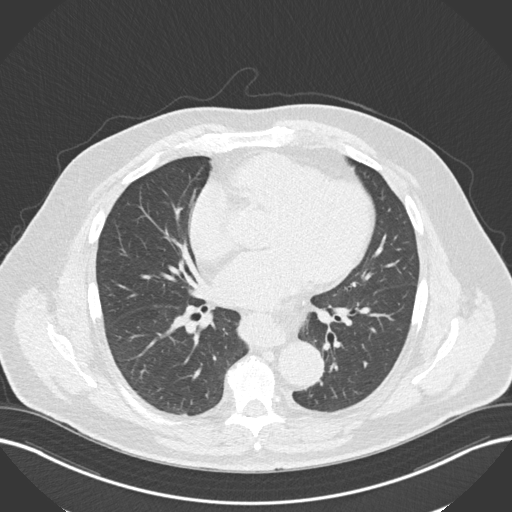
[im 94/158  lung]
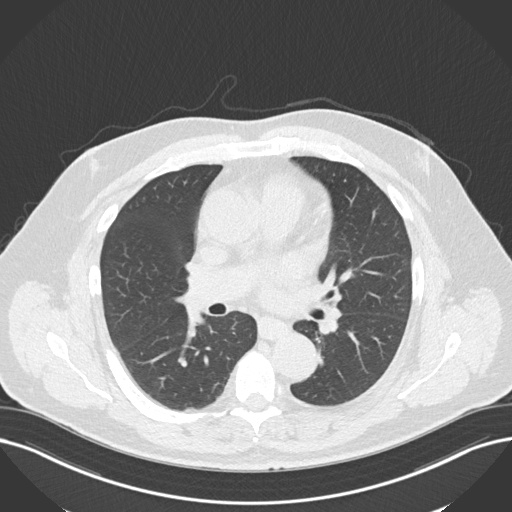
[im 105/158  lung]
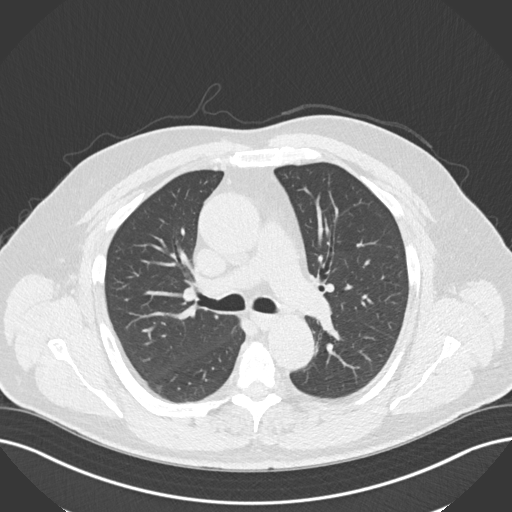
[im 123/158  mediastinal]
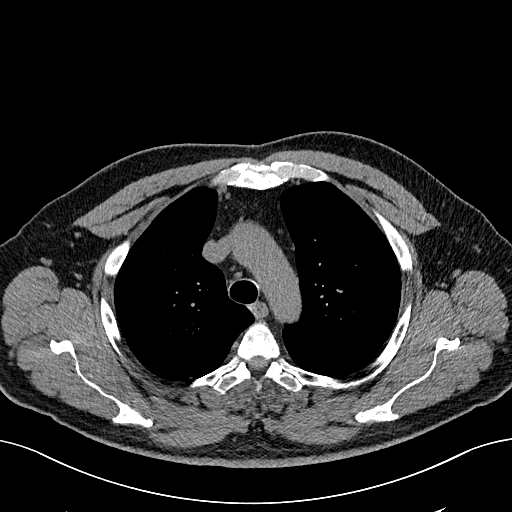
[im 123/158  lung]
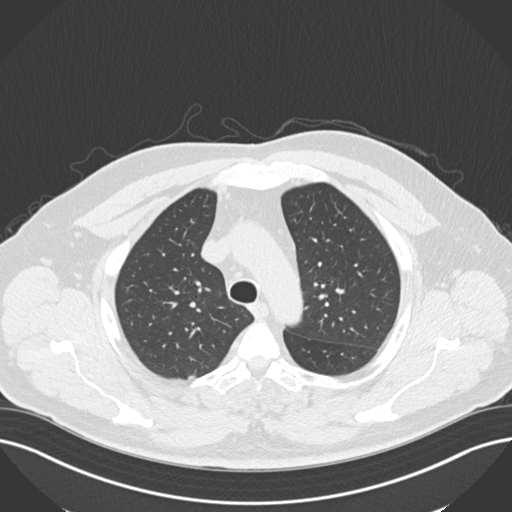
[im 134/158  lung]
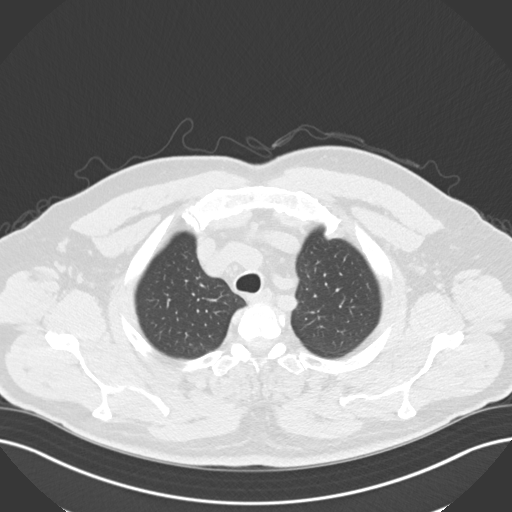
[im 146/158  lung]
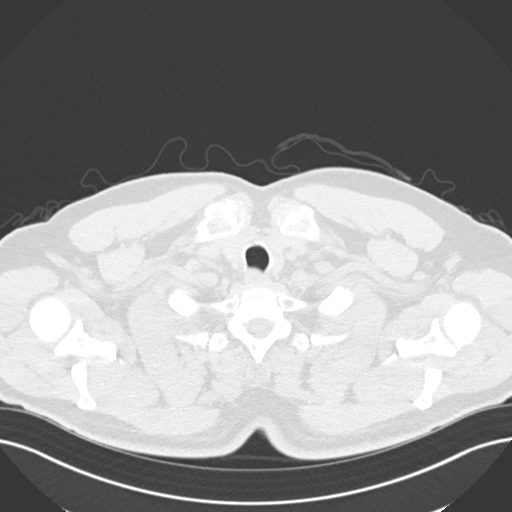

[Series 5: coronal · coronal · 0.64mm/px · 3 of 162 slices shown]
[im 33/162  lung]
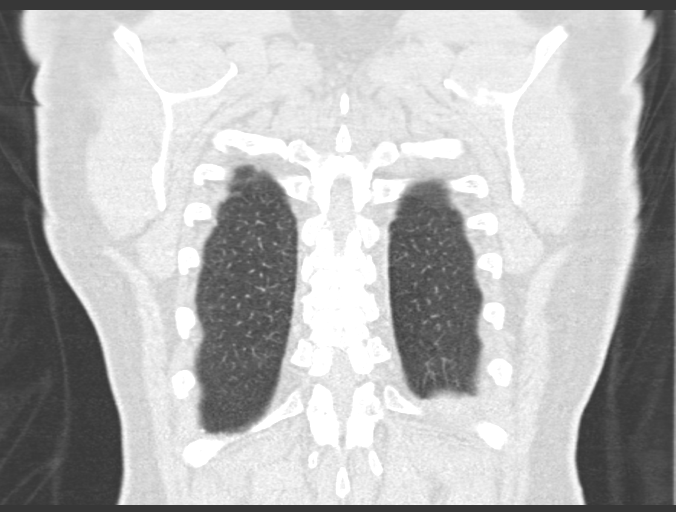
[im 65/162  lung]
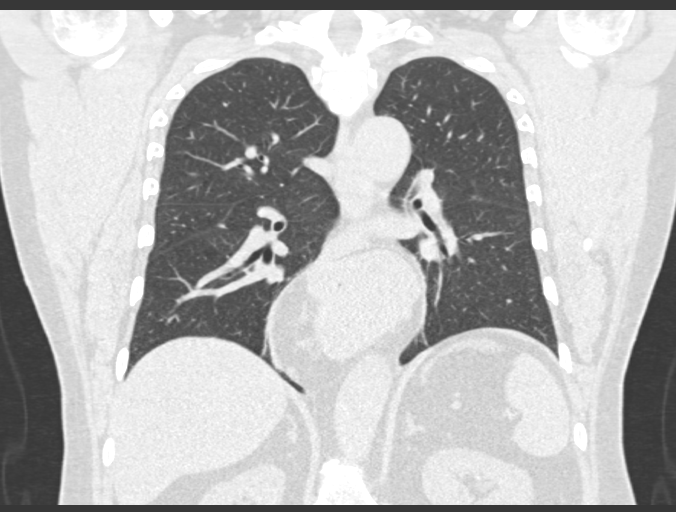
[im 97/162  lung]
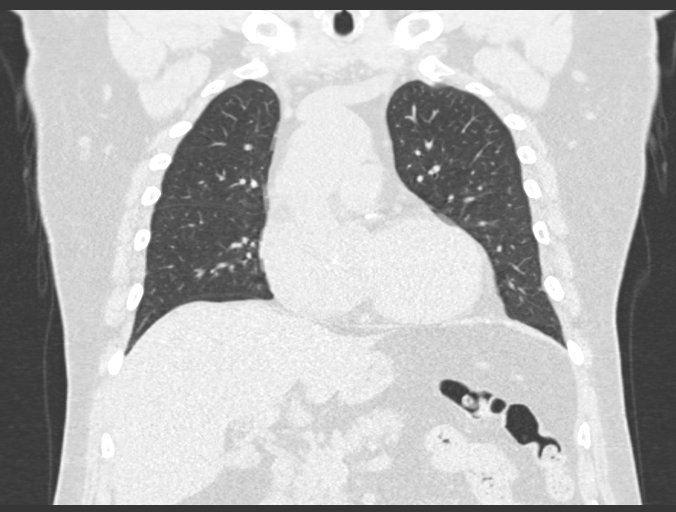

[14 of 36 positions shown; findings below may reference images not displayed]

FINDINGS: Cardiovascular: The heart size is normal. No substantial pericardial
effusion. Coronary artery calcification is evident. Ascending
thoracic aorta measures 4.6 cm diameter on image 53/2. This compares
to 4.3 cm diameter on the 05/28/2019 exam when I remeasure at the
same level and in the same dimension. Comparing back to an older CTA
Chest of 05/21/2018, ascending thoracic aorta was 4.0 cm diameter
when remeasured in a similar fashion.

Mediastinum/Nodes: No mediastinal lymphadenopathy. No evidence for
gross hilar lymphadenopathy although assessment is limited by the
lack of intravenous contrast on today's study. The esophagus has
normal imaging features. There is no axillary lymphadenopathy.
Moderate hiatal hernia.

Lungs/Pleura: 4 mm subpleural right middle lobe nodule on 84/4 is
unchanged consistent with benign etiology. 7-8 mm right lower lobe
nodule on 86/4 is unchanged since the 9258 study, consistent with
benign etiology. Several additional scattered tiny nodular densities
are stable. No new suspicious nodule or mass. No focal airspace
consolidation. No pleural effusion.

Upper Abdomen: Unremarkable.

Musculoskeletal: No worrisome lytic or sclerotic osseous
abnormality.
IMPRESSION: 1. Slow progression of ascending thoracic aortic aneurysm measuring
up to 4.6 cm diameter, increased from 4.3 cm on 05/28/2019 and
cm on 05/21/2018. Ascending thoracic aortic aneurysm. Recommend
semi-annual imaging followup by CTA or MRA and referral to
cardiothoracic surgery if not already obtained. This recommendation
follows 5747 ACCF/AHA/AATS/ACR/ASA/SCA/MOWETE/KLOSS/STF/JUMPER Guidelines
for the Diagnosis and Management of Patients With Thoracic Aortic
Disease. Circulation. 5747; 121: E266-e369. Aortic aneurysm NOS
([id number].B)
2. Stable bilateral pulmonary nodules, consistent with benign
etiology. 8 mm right lower lobe lesion previously questioned as
AVM/vascular anomaly although not well evaluated for such on today's
noncontrast imaging.
3. Moderate hiatal hernia.
4. Coronary artery atherosclerosis.
5. Aortic Atherosclerosis ([id number]).

## 2023-01-12 ENCOUNTER — Ambulatory Visit: Payer: Medicare HMO | Attending: Cardiology

## 2023-01-12 DIAGNOSIS — I7121 Aneurysm of the ascending aorta, without rupture: Secondary | ICD-10-CM

## 2023-01-12 DIAGNOSIS — R002 Palpitations: Secondary | ICD-10-CM

## 2023-01-12 LAB — ECHOCARDIOGRAM COMPLETE
Area-P 1/2: 2.74 cm2
S' Lateral: 3.3 cm

## 2023-01-12 NOTE — Progress Notes (Signed)
Exam observed by nicolle wallace

## 2023-01-19 ENCOUNTER — Telehealth: Payer: Self-pay

## 2023-01-19 NOTE — Telephone Encounter (Signed)
Results reviewed with pt as per Dr. Krasowski's note.  Pt verbalized understanding and had no additional questions. Routed to PCP  

## 2023-10-25 ENCOUNTER — Ambulatory Visit: Payer: Medicare HMO | Attending: Cardiology | Admitting: Cardiology

## 2023-10-25 ENCOUNTER — Encounter: Payer: Self-pay | Admitting: Cardiology

## 2023-10-25 VITALS — BP 140/82 | HR 76 | Ht 71.0 in | Wt 219.4 lb

## 2023-10-25 DIAGNOSIS — R002 Palpitations: Secondary | ICD-10-CM

## 2023-10-25 DIAGNOSIS — E782 Mixed hyperlipidemia: Secondary | ICD-10-CM

## 2023-10-25 DIAGNOSIS — I7121 Aneurysm of the ascending aorta, without rupture: Secondary | ICD-10-CM | POA: Diagnosis not present

## 2023-10-25 DIAGNOSIS — E0590 Thyrotoxicosis, unspecified without thyrotoxic crisis or storm: Secondary | ICD-10-CM | POA: Diagnosis not present

## 2023-10-25 DIAGNOSIS — I10 Essential (primary) hypertension: Secondary | ICD-10-CM | POA: Diagnosis not present

## 2023-10-25 NOTE — Progress Notes (Signed)
Cardiology Office Note:    Date:  10/25/2023   ID:  [name], DOB 10-Jun-1951, MRN [id number]  PCP:  Estell Harpin, Rutha Bouchard, NP  Cardiologist:  Gypsy Balsam, MD    Referring MD: Estell Harpin, Nigel Mormon*   Chief Complaint  Patient presents with   Follow-up    History of Present Illness:    Nicolas Stevens is a 72 y.o. male past medical history significant for ascending aortic aneurysm measuring 46 mm last year, essential hypertension, dyslipidemia, palpitation, anxiety.  Comes today to my office for follow-up overall doing well.  Denies of any chest pain tightness squeezing pressure burning chest  Past Medical History:  Diagnosis Date   Anxiety 10/17/2018   Ascending aortic aneurysm (HCC) 06/08/2018   Measuring 4.5 cm by echocardiogram   Depression with anxiety 10/17/2016   Esophageal reflux 10/29/2013   Hyperlipidemia 12/24/2013   Hypertension, benign 10/29/2013   Hyperthyroidism 06/17/2016   Palpitations 05/03/2018    Past Surgical History:  Procedure Laterality Date   NO PAST SURGERIES      Current Medications: Current Meds  Medication Sig   aspirin EC 81 MG tablet Take 1 tablet by mouth daily.   diclofenac Sodium (VOLTAREN) 1 % GEL Apply 2 g topically as needed (pain).   lisinopril (PRINIVIL,ZESTRIL) 20 MG tablet Take 1 tablet by mouth daily.   LORazepam (ATIVAN) 1 MG tablet Take 1 tablet by mouth daily as needed for anxiety.   methimazole (TAPAZOLE) 5 MG tablet Take 1 tablet by mouth daily.   metoprolol succinate (TOPROL-XL) 25 MG 24 hr tablet Take 25 mg by mouth daily.   omeprazole (PRILOSEC) 20 MG capsule Take 1 capsule by mouth daily.   pravastatin (PRAVACHOL) 40 MG tablet Take 1 tablet by mouth at bedtime.     Allergies:   Penicillins and Sulfa antibiotics   Social History   Socioeconomic History   Marital status: Married    Spouse name: Not on file   Number of children: Not on file   Years of education: Not on file   Highest education level:  Not on file  Occupational History   Not on file  Tobacco Use   Smoking status: Former   Smokeless tobacco: Never  Vaping Use   Vaping status: Never Used  Substance and Sexual Activity   Alcohol use: Never   Drug use: Never   Sexual activity: Not on file  Other Topics Concern   Not on file  Social History Narrative   Not on file   Social Determinants of Health   Financial Resource Strain: Not on file  Food Insecurity: Low Risk  (07/05/2023)   Received from [facility]   Hunger Vital Sign    Worried About Running Out of Food in the Last Year: Never true    Ran Out of Food in the Last Year: Never true  Transportation Needs: Not on file (07/05/2023)  Physical Activity: Not on file  Stress: Not on file  Social Connections: Not on file     Family History: The patient's family history includes Cancer in his father. ROS:   Please see the history of present illness.    All 14 point review of systems negative except as described per history of present illness  EKGs/Labs/Other Studies Reviewed:    EKG Interpretation Date/Time:  Wednesday October 25 2023 15:09:59 EST Ventricular Rate:  76 PR Interval:  140 QRS Duration:  102 QT Interval:  386 QTC Calculation: 434 R Axis:   -  13  Text Interpretation: Normal sinus rhythm Minimal voltage criteria for LVH, may be normal variant Borderline ECG No previous ECGs available Confirmed by Gypsy Balsam [phone]) on 10/25/2023 3:29:21 PM    Recent Labs: No results found for requested labs within last 365 days.  Recent Lipid Panel    Component Value Date/Time   CHOL 174 01/27/2021 0000   TRIG 133 01/27/2021 0000   HDL 43 01/27/2021 0000   CHOLHDL 4.0 01/27/2021 0000   LDLCALC 107 (H) 01/27/2021 0000    Physical Exam:    VS:  BP (!) 140/82 (BP Location: Left Arm, Patient Position: Sitting)   Pulse 76   Ht 5\' 11"  (1.803 m)   Wt 219 lb 6.4 oz (99.5 kg)   SpO2 95%   BMI 30.60 kg/m     Wt Readings from Last 3 Encounters:   10/25/23 219 lb 6.4 oz (99.5 kg)  10/05/22 217 lb 12.8 oz (98.8 kg)  01/17/22 225 lb (102.1 kg)     GEN:  Well nourished, well developed in no acute distress HEENT: Normal NECK: No JVD; No carotid bruits LYMPHATICS: No lymphadenopathy CARDIAC: RRR, no murmurs, no rubs, no gallops RESPIRATORY:  Clear to auscultation without rales, wheezing or rhonchi  ABDOMEN: Soft, non-tender, non-distended MUSCULOSKELETAL:  No edema; No deformity  SKIN: Warm and dry LOWER EXTREMITIES: no swelling NEUROLOGIC:  Alert and oriented x 3 PSYCHIATRIC:  Normal affect   ASSESSMENT:    1. Palpitations   2. Aneurysm of ascending aorta without rupture (HCC)   3. Hypertension, benign   4. Hyperthyroidism   5. Mixed hyperlipidemia    PLAN:    In order of problems listed above:  Palpitations denies having any. Ascending aortic aneurysm last measuring 46 mm.  Will recheck his CT angio of his chest to look at aneurysm. Essential hypertension elevated in the office but he said when he is at home it is always 1 15-1 20 systolic over 60s 70s diastolic.  Continue present management. Hypothyroidism on Tapazole followed by antimedicine team and endocrinology. Dyslipidemia I did review K PN which show LDL 100 HDL 42 we will continue present management   Medication Adjustments/Labs and Tests Ordered: Current medicines are reviewed at length with the patient today.  Concerns regarding medicines are outlined above.  Orders Placed This Encounter  Procedures   EKG 12-Lead   Medication changes: No orders of the defined types were placed in this encounter.   Signed, Georgeanna Lea, MD, [facility] 10/25/2023 3:36 PM    [facility]

## 2023-10-25 NOTE — Patient Instructions (Addendum)
Medication Instructions:  Your physician recommends that you continue on your current medications as directed. Please refer to the Current Medication list given to you today.  *If you need a refill on your cardiac medications before your next appointment, please call your pharmacy*   Lab Work: BMP- today If you have labs (blood work) drawn today and your tests are completely normal, you will receive your results only by: MyChart Message (if you have MyChart) OR A paper copy in the mail If you have any lab test that is abnormal or we need to change your treatment, we will call you to review the results.   Testing/Procedures: Non-Cardiac CT Angiography (CTA), is a special type of CT scan that uses a computer to produce multi-dimensional views of major blood vessels throughout the body. In CT angiography, a contrast material is injected through an IV to help visualize the blood vessels    Follow-Up: At [facility], you and your health needs are our priority.  As part of our continuing mission to provide you with exceptional heart care, we have created designated Provider Care Teams.  These Care Teams include your primary Cardiologist (physician) and Advanced Practice Providers (APPs -  Physician Assistants and Nurse Practitioners) who all work together to provide you with the care you need, when you need it.  We recommend signing up for the patient portal called "MyChart".  Sign up information is provided on this After Visit Summary.  MyChart is used to connect with patients for Virtual Visits (Telemedicine).  Patients are able to view lab/test results, encounter notes, upcoming appointments, etc.  Non-urgent messages can be sent to your provider as well.   To learn more about what you can do with MyChart, go to [email or web address].    Your next appointment:   6 month(s)  The format for your next appointment:   In Person  Provider:   Gypsy Balsam, MD    Other Instructions NA

## 2023-10-25 NOTE — Addendum Note (Signed)
Addended by: Baldo Ash D on: 10/25/2023 04:04 PM   Modules accepted: Orders

## 2023-10-26 LAB — BASIC METABOLIC PANEL
BUN/Creatinine Ratio: 14 (ref 10–24)
BUN: 18 mg/dL (ref 8–27)
CO2: 22 mmol/L (ref 20–29)
Calcium: 9.5 mg/dL (ref 8.6–10.2)
Chloride: 104 mmol/L (ref 96–106)
Creatinine, Ser: 1.32 mg/dL — ABNORMAL HIGH (ref 0.76–1.27)
Glucose: 88 mg/dL (ref 70–99)
Potassium: 4.4 mmol/L (ref 3.5–5.2)
Sodium: 142 mmol/L (ref 134–144)
eGFR: 58 mL/min/{1.73_m2} — ABNORMAL LOW (ref >59)

## 2023-11-08 ENCOUNTER — Telehealth: Payer: Self-pay

## 2023-11-08 NOTE — Telephone Encounter (Signed)
Left message on My Chart with normal lab results per Dr. Krasowski's note. Routed to PCP.  

## 2023-11-10 ENCOUNTER — Encounter: Payer: Self-pay | Admitting: Cardiology

## 2024-02-25 ENCOUNTER — Encounter: Payer: Self-pay | Admitting: Cardiology

## 2024-02-26 ENCOUNTER — Telehealth: Payer: Self-pay

## 2024-02-26 MED ORDER — HYDRALAZINE HCL 10 MG PO TABS: 10.0000 mg | ORAL_TABLET | Freq: Three times a day (TID) | ORAL | 3 refills | Status: DC

## 2024-02-26 NOTE — Telephone Encounter (Signed)
 Spoke with pt regarding message sent with BP readings. Dr. Bing Matter reviewed and recommended Hydralazine 10mg  1 tablet Tid. Sent to [facility]

## 2024-03-07 ENCOUNTER — Encounter: Payer: Self-pay | Admitting: *Deleted

## 2024-03-07 NOTE — Progress Notes (Unsigned)
 Cardiology Office Note:  .   Date:  03/08/2024  ID:  Nicolas Stevens, DOB 08-27-1951, MRN [id number] PCP: Sydnee Levans, MD  [facility] Providers Cardiologist:  Gypsy Balsam, MD    History of Present Illness: .   Nicolas Stevens is a 73 y.o. male with a past medical history of hypertension, ascending aortic aneurysm, hyper thyroidism, dyslipidemia, palpitations.  11/08/2023 CT of the chest ascending thoracic aortic aneurysm stable at 4.7 cm 01/12/2023 echo EF 66 5%, grade 1 DD, aneurysm of the ascending aorta 47 mm 01/19/2022 echo EF 60 to 65%, moderate LVH, grade 2 DD, Aneurysm of ascending aorta 46 mm 08/07/2020 echo EF 60 to 65%, borderline LVH, grade 1 DD 05/28/2018 monitor average heart rate 60 bpm, 249 episodes of SVT  Most recently evaluated by Dr. Bing Matter on 10/25/2023, he was stable from a cardiac perspective, palpitations were well-controlled.  Repeat imaging was arranged for surveillance of his ascending aortic aneurysm, he was advised to follow-up in 6 months.  CT of the chest in December 2024 revealed ascending thoracic aortic aneurysm was stable at 4.7 cm with trace plaque in the aorta.  He presents today with concerns of uncontrolled hypertension.  He has been evaluated in the emergency department, also by his PCP for management of his hypertension, he has been keeping a blood pressure log over the last few weeks and it appears over the last few days it is better controlled.  It is elevated in the office today at 165/74 initially.  He is understandably concerned about the effects this might have on his aneurysm.  He does try to stay active at home, walks for exercise with his wife. He denies chest pain, palpitations, dyspnea, pnd, orthopnea, n, v, dizziness, syncope, edema, weight gain, or early satiety.    ROS: Review of Systems  All other systems reviewed and are negative.    Studies Reviewed: .        Cardiac Studies & Procedures    ______________________________________________________________________________________________     ECHOCARDIOGRAM  ECHOCARDIOGRAM COMPLETE 01/12/2023  Narrative ECHOCARDIOGRAM REPORT    Patient Name:   Nicolas Stevens Date of Exam: 01/12/2023 Medical Rec #:  [id number]       Height:       72.0 in Accession #:    [id number]      Weight:       217.8 lb Date of Birth:  [birth year]      BSA:          2.209 m Patient Age:    71 years        BP:           128/80 mmHg Patient Gender: M               HR:           65 bpm. Exam Location:  [place]  Procedure: 2D Echo, Cardiac Doppler, Color Doppler and Strain Analysis  Indications:    Aneurysm of ascending aorta without rupture [I71.21]  History:        Patient has prior history of Echocardiogram examinations, most recent 01/19/2022. Risk Factors:Hypertension and Dyslipidemia.  Sonographer:    Louie Boston RDCS Referring Phys: [id number] Marveen Reeks KRASOWSKI  IMPRESSIONS   1. Left ventricular ejection fraction, by estimation, is 60 to 65%. The left ventricle has normal function. The left ventricle has no regional wall motion abnormalities. Left ventricular diastolic parameters are consistent with Grade I diastolic dysfunction (impaired relaxation). GLS -9.8% 2.  Right ventricular systolic function is normal. The right ventricular size is mildly enlarged. There is normal pulmonary artery systolic pressure. 3. The mitral valve is normal in structure. Trivial mitral valve regurgitation. No evidence of mitral stenosis. 4. The aortic valve is normal in structure. Aortic valve regurgitation is not visualized. No aortic stenosis is present. 5. Aneurysm of the ascending aorta, measuring 47 mm. 6. The inferior vena cava is normal in size with greater than 50% respiratory variability, suggesting right atrial pressure of 3 mmHg.  FINDINGS Left Ventricle: Left ventricular ejection fraction, by estimation, is 60 to 65%. The left ventricle has normal  function. The left ventricle has no regional wall motion abnormalities. The left ventricular internal cavity size was normal in size. There is no left ventricular hypertrophy. Left ventricular diastolic parameters are consistent with Grade I diastolic dysfunction (impaired relaxation).  Right Ventricle: The right ventricular size is mildly enlarged. No increase in right ventricular wall thickness. Right ventricular systolic function is normal. There is normal pulmonary artery systolic pressure. The tricuspid regurgitant velocity is 2.32 m/s, and with an assumed right atrial pressure of 3 mmHg, the estimated right ventricular systolic pressure is 24.5 mmHg.  Left Atrium: Left atrial size was normal in size.  Right At[facility]: Right atrial size was normal in size.  Pericardium: There is no evidence of pericardial effusion.  Mitral Valve: The mitral valve is normal in structure. Trivial mitral valve regurgitation. No evidence of mitral valve stenosis.  Tricuspid Valve: The tricuspid valve is normal in structure. Tricuspid valve regurgitation is mild . No evidence of tricuspid stenosis.  Aortic Valve: The aortic valve is normal in structure. Aortic valve regurgitation is not visualized. No aortic stenosis is present.  Pulmonic Valve: The pulmonic valve was normal in structure. Pulmonic valve regurgitation is not visualized. No evidence of pulmonic stenosis.  Aorta: The aortic root is normal in size and structure. There is an aneurysm involving the ascending aorta measuring 47 mm.  Venous: The inferior vena cava is normal in size with greater than 50% respiratory variability, suggesting right atrial pressure of 3 mmHg.  IAS/Shunts: No atrial level shunt detected by color flow Doppler.   LEFT VENTRICLE PLAX 2D LVIDd:         4.50 cm   Diastology LVIDs:         3.30 cm   LV e' medial:    5.77 cm/s LV PW:         1.20 cm   LV E/e' medial:  14.9 LV IVS:        1.20 cm   LV e' lateral:   6.42  cm/s LVOT diam:     2.00 cm   LV E/e' lateral: 13.3 LV SV:         83 LV SV Index:   38 LVOT Area:     3.14 cm   RIGHT VENTRICLE RV S prime:     14.60 cm/s TAPSE (M-mode): 3.0 cm  LEFT ATRIUM             Index        RIGHT ATRIUM           Index LA diam:        3.40 cm 1.54 cm/m   RA Area:     14.10 cm LA Vol (A2C):   62.0 ml 28.06 ml/m  RA Volume:   31.20 ml  14.12 ml/m LA Vol (A4C):   58.7 ml 26.57 ml/m LA Biplane Vol: 60.1 ml 27.20  ml/m AORTIC VALVE LVOT Vmax:   117.00 cm/s LVOT Vmean:  85.000 cm/s LVOT VTI:    0.264 m  AORTA Ao Root diam: 3.30 cm Ao Asc diam:  4.70 cm  MITRAL VALVE               TRICUSPID VALVE MV Area (PHT): 2.74 cm    TR Peak grad:   21.5 mmHg MV Decel Time: 277 msec    TR Vmax:        232.00 cm/s MV E velocity: 85.70 cm/s MV A velocity: 98.50 cm/s  SHUNTS MV E/A ratio:  0.87        Systemic VTI:  0.26 m Systemic Diam: 2.00 cm  Belva Crome MD Electronically signed by Belva Crome MD Signature Date/Time: 01/12/2023/12:06:02 PM    Final    MONITORS  LONG TERM MONITOR (3-14 DAYS) 05/28/2018  Narrative Nicolas Stevens, DOB 1951/02/23, MRN [id number]  HOLTER MONITOR REPORT:    Date of test:                 05/28/2018 Duration of test:           7 days Indication:                    Palpitations and bradycardia Ordering physician:  Georgeanna Lea MD Referring physician:  Georgeanna Lea MD   Baseline rhythm: Sinus rhythm  Minimum heart rate: 43 BPM.  Average heart rate: 60 BPM.  Maximal heart rate 110 BPM.  Atrial arrhythmia: Total of 249 premature supraventricular beats with 8 runs of narrow complex tachycardia.  Longest 6 beats fastest 127 bpm  Ventricular arrhythmia: Total of 21 premature ventricular beats  Conduction abnormality: None  Symptoms: None   Conclusion: Long-term Holter monitor showing bradycardia but not critical.  Few supraventricular beats with few runs of narrow complex tachycardia but longest  one only 6 beats at rate of 127  Interpreting  cardiologist: Gypsy Balsam, MD Date: 06/11/2018 11:54 AM       ______________________________________________________________________________________________      Risk Assessment/Calculations:     HYPERTENSION CONTROL Vitals:   03/08/24 0823 03/08/24 0934  BP: (!) 165/74 (!) 144/72    The patient's blood pressure is elevated above target today.  In order to address the patient's elevated BP: Blood pressure will be monitored at home to determine if medication changes need to be made.; A current anti-hypertensive medication was adjusted today.          Physical Exam:   VS:  BP (!) 144/72   Pulse 99   Ht 5\' 11"  (1.803 m)   Wt 217 lb (98.4 kg)   SpO2 96%   BMI 30.27 kg/m    Wt Readings from Last 3 Encounters:  03/08/24 217 lb (98.4 kg)  10/25/23 219 lb 6.4 oz (99.5 kg)  10/05/22 217 lb 12.8 oz (98.8 kg)    GEN: Well nourished, well developed in no acute distress NECK: No JVD; No carotid bruits CARDIAC: RRR, no murmurs, rubs, gallops RESPIRATORY:  Clear to auscultation without rales, wheezing or rhonchi  ABDOMEN: Soft, non-tender, non-distended EXTREMITIES:  No edema; No deformity   ASSESSMENT AND PLAN: .   Hypertension- this is the purpose of his visit today, blood pressure initially elevated at 165/74 >> recheck was 144/72.  He has been keeping a blood pressure log at home, some adjustments have been made to his antihypertensive regimen and his blood pressure appears to be better controlled but is  still not at goal.  We will stop his metoprolol and will start him on Coreg 6.25 mg twice daily.  He will keep a blood pressure log for around a week and then send me his results via MyChart.  Continue lisinopril 40 mg daily, continue Apresoline 10 mg 3 times daily.  Thoracic aortic aneurysm -CT in December 2024  thoracic aortic aneurysm stable at 4.7 cm, will repeat echocardiogram for surveillance, also has some DOE we will  assess for that as well.  He is a non-smoker.  He does mention that his brother had an abdominal aortic aneurysm.  It does not appear we have evaluated the abdominal part of his aorta in some time so we will arrange for AAA duplex as well.  DOE-most noticeably when he is working out, will repeat echocardiogram for any contributory causes.  Palpitations -currently quiescent.  Continue beta-blocker.        Dispo: Stop metoprolol, start Coreg 6.25 mg twice daily, echocardiogram, AAA duplex.  Send MyChart message for blood pressure readings in approximately a week.  Follow-up in 6 months.  Signed, Flossie Dibble, NP

## 2024-03-08 ENCOUNTER — Encounter: Payer: Self-pay | Admitting: Cardiology

## 2024-03-08 ENCOUNTER — Ambulatory Visit: Attending: Cardiology | Admitting: Cardiology

## 2024-03-08 VITALS — BP 144/72 | HR 99 | Ht 71.0 in | Wt 217.0 lb

## 2024-03-08 DIAGNOSIS — I10 Essential (primary) hypertension: Secondary | ICD-10-CM | POA: Diagnosis not present

## 2024-03-08 DIAGNOSIS — I7121 Aneurysm of the ascending aorta, without rupture: Secondary | ICD-10-CM | POA: Diagnosis not present

## 2024-03-08 DIAGNOSIS — R0609 Other forms of dyspnea: Secondary | ICD-10-CM

## 2024-03-08 DIAGNOSIS — R002 Palpitations: Secondary | ICD-10-CM

## 2024-03-08 MED ORDER — CARVEDILOL 6.25 MG PO TABS: 6.2500 mg | ORAL_TABLET | Freq: Two times a day (BID) | ORAL | 3 refills | Status: DC

## 2024-03-08 NOTE — Patient Instructions (Signed)
 Medication Instructions:  Your physician has recommended you make the following change in your medication:  Stop Metoprolol Start Carvedilol 6.25 two times daily  *If you need a refill on your cardiac medications before your next appointment, please call your pharmacy*  Lab Work: NONE If you have labs (blood work) drawn today and your tests are completely normal, you will receive your results only by: MyChart Message (if you have MyChart) OR A paper copy in the mail If you have any lab test that is abnormal or we need to change your treatment, we will call you to review the results.  Testing/Procedures: Your physician has requested that you have an echocardiogram. Echocardiography is a painless test that uses sound waves to create images of your heart. It provides your doctor with information about the size and shape of your heart and how well your heart's chambers and valves are working. This procedure takes approximately one hour. There are no restrictions for this procedure. Please do NOT wear cologne, perfume, aftershave, or lotions (deodorant is allowed). Please arrive 15 minutes prior to your appointment time.  Please note: We ask at that you not bring children with you during ultrasound (echo/ vascular) testing. Due to room size and safety concerns, children are not allowed in the ultrasound rooms during exams. Our front office staff cannot provide observation of children in our lobby area while testing is being conducted. An adult accompanying a patient to their appointment will only be allowed in the ultrasound room at the discretion of the ultrasound technician under special circumstances. We apologize for any inconvenience.   Your physician has requested that you have an abdominal aorta duplex. During this test, an ultrasound is used to evaluate the aorta. Allow 30 minutes for this exam. Do not eat after midnight the day before and avoid carbonated beverages.  Please note: We ask at  that you not bring children with you during ultrasound (echo/ vascular) testing. Due to room size and safety concerns, children are not allowed in the ultrasound rooms during exams. Our front office staff cannot provide observation of children in our lobby area while testing is being conducted. An adult accompanying a patient to their appointment will only be allowed in the ultrasound room at the discretion of the ultrasound technician under special circumstances. We apologize for any inconvenience.   Follow-Up: At [facility], you and your health needs are our priority.  As part of our continuing mission to provide you with exceptional heart care, our providers are all part of one team.  This team includes your primary Cardiologist (physician) and Advanced Practice Providers or APPs (Physician Assistants and Nurse Practitioners) who all work together to provide you with the care you need, when you need it.  Your next appointment:   6 month(s)  Provider:   Gypsy Balsam, MD, Wallis Bamberg, NP Rosalita Levan)    We recommend signing up for the patient portal called "MyChart".  Sign up information is provided on this After Visit Summary.  MyChart is used to connect with patients for Virtual Visits (Telemedicine).  Patients are able to view lab/test results, encounter notes, upcoming appointments, etc.  Non-urgent messages can be sent to your provider as well.   To learn more about what you can do with MyChart, go to [email or web address].   Other Instructions In 10 days send [place] BP readings through MyChart

## 2024-03-21 MED ORDER — CARVEDILOL 12.5 MG PO TABS: 12.5000 mg | ORAL_TABLET | Freq: Two times a day (BID) | ORAL | 3 refills | Status: DC

## 2024-04-01 ENCOUNTER — Other Ambulatory Visit: Payer: Self-pay

## 2024-04-01 ENCOUNTER — Telehealth: Payer: Self-pay | Admitting: Cardiology

## 2024-04-01 NOTE — Telephone Encounter (Signed)
 Pt c/o BP issue:  1. What are your last 5 BP readings? 108/65 115/67 03-27-24, 03-28-24  115/67, 127/69  03-29-24-  128/71  169/93,  03-30-24 161/91  162/89   03-31-24  137/66, 179/97  2. Are you having any other symptoms (ex. Dizziness, headache, blurred vision, passed out)?  Little headache off and on  3. What is your medication issue? Blood pressure going up and down

## 2024-04-01 NOTE — Telephone Encounter (Signed)
 Called the patient and he is concerned because his blood pressure keeps going up and down. Please see his blood pressure results below:  03-27-24 108/65 115/67, 03-28-24  115/67, 127/69  03-29-24-  128/71  169/93,  03-30-24 161/91  162/89   03-31-24  137/66, 179/97, 04-01-24 - 182/98   Patient is also reporting that he has been having headaches off and on. He also reported that he takes his hydralazine  in the middle of the morning instead of when he takes his other morning medication. I instructed him to take all of his morning medication together and then 2 hours later to check his blood pressure. This way we can get a good representation of if the medication is working to lower his blood pressure. Patient verbalized understanding.

## 2024-04-02 NOTE — Telephone Encounter (Signed)
 Called the patient and encouraged him to take all of his medication as prescribed. This way when he checks his blood pressure he will not get different readings because he has not taken all of his blood pressure medication yet. Patient verbalized understanding and had no further questions at this time.

## 2024-04-08 ENCOUNTER — Ambulatory Visit (INDEPENDENT_AMBULATORY_CARE_PROVIDER_SITE_OTHER)

## 2024-04-08 ENCOUNTER — Ambulatory Visit: Attending: Cardiology

## 2024-04-08 DIAGNOSIS — I7121 Aneurysm of the ascending aorta, without rupture: Secondary | ICD-10-CM

## 2024-04-08 DIAGNOSIS — R0609 Other forms of dyspnea: Secondary | ICD-10-CM

## 2024-04-08 LAB — ECHOCARDIOGRAM COMPLETE
AR max vel: 1.86 cm2
AV Area VTI: 1.99 cm2
AV Area mean vel: 1.85 cm2
AV Mean grad: 7.9 mmHg
AV Peak grad: 17.2 mmHg
Ao pk vel: 2.07 m/s
S' Lateral: 3.8 cm

## 2024-04-09 ENCOUNTER — Ambulatory Visit: Payer: Self-pay | Admitting: Cardiology

## 2024-09-06 NOTE — Progress Notes (Signed)
 Cardiology Office Note:  .   Date:  09/10/2024  ID:  Nicolas Stevens, DOB 06-08-1951, MRN [id number] PCP: Izora Lia Mt, MD  [facility] Providers Cardiologist:  Lamar Fitch, MD    History of Present Illness: .   Nicolas Stevens is a 73 y.o. male with a past medical history of hypertension, ascending aortic aneurysm, hyperthyroidism, dyslipidemia, palpitations.  04/08/2024 echo EF 60-65%, mild MR, mild thickening of the aortic valve with mild stenosis, moderate dilatation ascending aorta 47 mm 11/08/2023 CT of the chest ascending thoracic aortic aneurysm stable at 47mm 01/12/2023 echo EF 60-65%, grade 1 DD, aneurysm of the ascending aorta 47 mm 01/19/2022 echo EF 60 to 65%, moderate LVH, grade 2 DD, Aneurysm of ascending aorta 46 mm 08/07/2020 echo EF 60 to 65%, borderline LVH, grade 1 DD 05/28/2018 monitor average heart rate 60 bpm, 249 episodes of SVT  He is a longstanding patient of Dr. Fitch initially establishing with him in 2019 for the evaluation and management of his hypertension.  A monitor was arranged at that time revealing SVT.  In 2023 he underwent an echocardiogram revealing preserved ejection fraction, grade 2 DD and aneurysm of ascending aorta at 46 mm.  CT of his chest was arranged in August 2022, aorta 46 mm >> CT chest 10/2023, stable aneurysm 47 mm.  Most recently he was evaluated by myself on 03/08/2024, his blood pressure had been uncontrolled he had been evaluated in the emergency department also by his PCP we stopped his metoprolol  and transition him to carvedilol  6.25 mg twice daily and advised him to keep a blood pressure log, plans to follow-up after blood pressure log received.  He presents today for follow up, doing ok since last in our office, BP well controlled at last few office visits (PCP and endocrinology), felt dizzy over the weekend and wants to decrease his medications. He denies chest pain, palpitations, dyspnea, pnd, orthopnea, n,  v,syncope, edema, weight gain, or early satiety.   ROS: Review of Systems  Neurological:  Positive for dizziness.  All other systems reviewed and are negative.    Studies Reviewed: .        Cardiac Studies & Procedures   ______________________________________________________________________________________________     ECHOCARDIOGRAM  ECHOCARDIOGRAM COMPLETE 04/08/2024  Narrative ECHOCARDIOGRAM REPORT    Patient Name:   Nicolas Stevens Date of Exam: 04/08/2024 Medical Rec #:  [id number]       Height:       71.0 in Accession #:    [id number]      Weight:       217.0 lb Date of Birth:  Jun 10, 1951      BSA:          2.183 m Patient Age:    72 years        BP:           144/72 mmHg Patient Gender: M               HR:           55 bpm. Exam Location:  [place]  Procedure: 2D Echo, Cardiac Doppler, Color Doppler and Strain Analysis (Both Spectral and Color Flow Doppler were utilized during procedure).  Indications:    Dyspnea R06.00  History:        Patient has prior history of Echocardiogram examinations, most recent 01/12/2023. Signs/Symptoms:Ascending aortic aneurysm; Risk Factors:Hypertension and Dyslipidemia.  Sonographer:    Lynwood Silvas RDCS Referring Phys: [phone] Nicolas Stevens  IMPRESSIONS  1. Left ventricular ejection fraction, by estimation, is 60 to 65%. The left ventricle has normal function. The left ventricle has no regional wall motion abnormalities. Left ventricular diastolic parameters were normal. The average left ventricular global longitudinal strain is -20.5 %. The global longitudinal strain is normal. 2. Right ventricular systolic function is normal. The right ventricular size is normal. There is normal pulmonary artery systolic pressure. 3. The mitral valve is grossly normal. Mild mitral valve regurgitation. No evidence of mitral stenosis. 4. The aortic valve is tricuspid. There is mild thickening of the aortic valve. Aortic valve regurgitation is not  visualized. Mild aortic valve stenosis. Aortic valve area, by VTI measures 1.99 cm. Aortic valve mean gradient measures 7.9 mmHg. Aortic valve Vmax measures 2.07 m/s. 5. There is moderate dilatation of the ascending aorta, measuring 47 mm. 6. The inferior vena cava is normal in size with greater than 50% respiratory variability, suggesting right atrial pressure of 3 mmHg.  Comparison(s): In comparison prior echocardiogram report from 01/12/2023 reported ascending aorta 4.7 cm size. No aortic stenosis.  FINDINGS Left Ventricle: Left ventricular ejection fraction, by estimation, is 60 to 65%. The left ventricle has normal function. The left ventricle has no regional wall motion abnormalities. The average left ventricular global longitudinal strain is -20.5 %. Strain was performed and the global longitudinal strain is normal. The left ventricular internal cavity size was normal in size. There is no left ventricular hypertrophy. Left ventricular diastolic parameters were normal.  Right Ventricle: The right ventricular size is normal. Right vetricular wall thickness was not well visualized. Right ventricular systolic function is normal. There is normal pulmonary artery systolic pressure. The tricuspid regurgitant velocity is 2.61 m/s, and with an assumed right atrial pressure of 3 mmHg, the estimated right ventricular systolic pressure is 30.2 mmHg.  Left Atrium: Left atrial size was normal in size.  Right Atrium: Right atrial size was normal in size.  Pericardium: There is no evidence of pericardial effusion.  Mitral Valve: The mitral valve is grossly normal. Mild mitral valve regurgitation. No evidence of mitral valve stenosis.  Tricuspid Valve: The tricuspid valve is grossly normal. Tricuspid valve regurgitation is mild . No evidence of tricuspid stenosis.  Aortic Valve: The aortic valve is tricuspid. There is mild thickening of the aortic valve. Aortic valve regurgitation is not visualized.  Mild aortic stenosis is present. Aortic valve mean gradient measures 7.9 mmHg. Aortic valve peak gradient measures 17.2 mmHg. Aortic valve area, by VTI measures 1.99 cm.  Pulmonic Valve: The pulmonic valve was grossly normal. Pulmonic valve regurgitation is trivial. No evidence of pulmonic stenosis.  Aorta: The aortic root is normal in size and structure. There is moderate dilatation of the ascending aorta, measuring 47 mm.  Venous: The inferior vena cava is normal in size with greater than 50% respiratory variability, suggesting right atrial pressure of 3 mmHg.  IAS/Shunts: The interatrial septum was not well visualized.   LEFT VENTRICLE PLAX 2D LVIDd:         5.00 cm   Diastology LVIDs:         3.80 cm   LV e' medial:    7.40 cm/s LV PW:         1.10 cm   LV E/e' medial:  13.9 LV IVS:        1.00 cm   LV e' lateral:   10.30 cm/s LVOT diam:     2.10 cm   LV E/e' lateral: 10.0 LV SV:  89 LV SV Index:   41        2D Longitudinal Strain LVOT Area:     3.46 cm  2D Strain GLS Avg:     -20.5 %   RIGHT VENTRICLE             IVC RV Basal diam:  3.20 cm     IVC diam: 1.50 cm RV S prime:     12.70 cm/s TAPSE (M-mode): 2.5 cm  LEFT ATRIUM             Index        RIGHT ATRIUM           Index LA diam:        3.70 cm 1.69 cm/m   RA Area:     21.30 cm LA Vol (A2C):   70.0 ml 32.07 ml/m  RA Volume:   63.70 ml  29.18 ml/m LA Vol (A4C):   57.2 ml 26.20 ml/m LA Biplane Vol: 67.9 ml 31.10 ml/m AORTIC VALVE AV Area (Vmax):    1.86 cm AV Area (Vmean):   1.85 cm AV Area (VTI):     1.99 cm AV Vmax:           207.31 cm/s AV Vmean:          131.318 cm/s AV VTI:            0.449 m AV Peak Grad:      17.2 mmHg AV Mean Grad:      7.9 mmHg LVOT Vmax:         111.50 cm/s LVOT Vmean:        70.200 cm/s LVOT VTI:          0.258 m LVOT/AV VTI ratio: 0.57  AORTA Ao Root diam: 3.30 cm Ao Desc diam: 2.60 cm  MV E velocity: 103.00 cm/s  TRICUSPID VALVE MV A velocity: 75.20 cm/s    TR Peak grad:   27.2 mmHg MV E/A ratio:  1.37         TR Vmax:        261.00 cm/s  SHUNTS Systemic VTI:  0.26 m Systemic Diam: 2.10 cm  Sreedhar reddy Madireddy Electronically signed by Alean reddy Madireddy Signature Date/Time: 04/08/2024/7:39:08 PM    Final    MONITORS  LONG TERM MONITOR (3-14 DAYS) 05/28/2018  Narrative Nicolas Stevens, DOB 1951/11/24, MRN [id number]  HOLTER MONITOR REPORT:    Date of test:                 05/28/2018 Duration of test:           7 days Indication:                    Palpitations and bradycardia Ordering physician:  Lamar JINNY Fitch MD Referring physician:  Lamar JINNY Fitch MD   Baseline rhythm: Sinus rhythm  Minimum heart rate: 43 BPM.  Average heart rate: 60 BPM.  Maximal heart rate 110 BPM.  Atrial arrhythmia: Total of 249 premature supraventricular beats with 8 runs of narrow complex tachycardia.  Longest 6 beats fastest 127 bpm  Ventricular arrhythmia: Total of 21 premature ventricular beats  Conduction abnormality: None  Symptoms: None   Conclusion: Long-term Holter monitor showing bradycardia but not critical.  Few supraventricular beats with few runs of narrow complex tachycardia but longest one only 6 beats at rate of 127  Interpreting  cardiologist: Lamar Fitch, MD Date: 06/11/2018 11:54 AM  ______________________________________________________________________________________________      Risk Assessment/Calculations:             Physical Exam:   VS:  BP 110/62   Pulse 74   Ht 5' 11.6 (1.819 m)   Wt 215 lb 6.4 oz (97.7 kg)   SpO2 94%   BMI 29.54 kg/m    Wt Readings from Last 3 Encounters:  09/10/24 215 lb 6.4 oz (97.7 kg)  03/08/24 217 lb (98.4 kg)  10/25/23 219 lb 6.4 oz (99.5 kg)    GEN: Well nourished, well developed in no acute distress NECK: No JVD; No carotid bruits CARDIAC: RRR, no murmurs, rubs, gallops RESPIRATORY:  Clear to auscultation without rales, wheezing or rhonchi   ABDOMEN: Soft, non-tender, non-distended EXTREMITIES:  No edema; No deformity   ASSESSMENT AND PLAN: .   Hypertension- BP well controlled and he would like to decrease his apresoline  to 10 mg TID, continue Coreg  6.25 mg twice daily, lisinopril 40 mg daily.   Thoracic aortic aneurysm -AAA duplex negative for abdominal aneurysm, CT of his chest 11/08/2023 revealed ascending aortic aneurysm at 47 mm, unchanged from prior exam.  His blood pressure is well-controlled he is non-smoker.  Repeat CT of his chest is already been arranged.  Dyslipidemia - managed by PCP, on pravastatin .    Palpitations -currently quiescent.  Continue beta-blocker.        Dispo: Decrease hydralazine  to 10 mg 3 times daily per his request, blood pressure log for 2 weeks to make sure his blood pressures are still adequately controlled, follow-up in 6 months.  Signed, Nicolas JAYSON Hoover, NP

## 2024-09-10 ENCOUNTER — Encounter: Payer: Self-pay | Admitting: Cardiology

## 2024-09-10 ENCOUNTER — Ambulatory Visit: Attending: Cardiology | Admitting: Cardiology

## 2024-09-10 VITALS — BP 110/62 | HR 74 | Ht 71.6 in | Wt 215.4 lb

## 2024-09-10 DIAGNOSIS — I7121 Aneurysm of the ascending aorta, without rupture: Secondary | ICD-10-CM

## 2024-09-10 DIAGNOSIS — R002 Palpitations: Secondary | ICD-10-CM

## 2024-09-10 DIAGNOSIS — I10 Essential (primary) hypertension: Secondary | ICD-10-CM

## 2024-09-10 DIAGNOSIS — E782 Mixed hyperlipidemia: Secondary | ICD-10-CM

## 2024-09-10 MED ORDER — HYDRALAZINE HCL 10 MG PO TABS: 10.0000 mg | ORAL_TABLET | Freq: Three times a day (TID) | ORAL | 3 refills | Status: AC

## 2024-09-10 NOTE — Patient Instructions (Signed)
 Medication Instructions:   DECREASE: Hydralazine  to 10mg  1 tablet 3 times daily   Lab Work: None Ordered If you have labs (blood work) drawn today and your tests are completely normal, you will receive your results only by: MyChart Message (if you have MyChart) OR A paper copy in the mail If you have any lab test that is abnormal or we need to change your treatment, we will call you to review the results.   Testing/Procedures: None Ordered   Follow-Up: At [facility], you and your health needs are our priority.  As part of our continuing mission to provide you with exceptional heart care, we have created designated Provider Care Teams.  These Care Teams include your primary Cardiologist (physician) and Advanced Practice Providers (APPs -  Physician Assistants and Nurse Practitioners) who all work together to provide you with the care you need, when you need it.  We recommend signing up for the patient portal called MyChart.  Sign up information is provided on this After Visit Summary.  MyChart is used to connect with patients for Virtual Visits (Telemedicine).  Patients are able to view lab/test results, encounter notes, upcoming appointments, etc.  Non-urgent messages can be sent to your provider as well.   To learn more about what you can do with MyChart, go to [email or web address].    Your next appointment:   6 month(s)  The format for your next appointment:   In Person  Provider:   Delon Hoover, NP   Other Instructions Blood Pressure Log  for 2 weeks
